# Patient Record
Sex: Female | Born: 1965
Health system: Southern US, Community
[De-identification: ages and names within clinical notes are randomized; demographics above are authoritative.]

## PROBLEM LIST (undated history)

## (undated) DIAGNOSIS — I10 Essential (primary) hypertension: Secondary | ICD-10-CM

## (undated) DIAGNOSIS — K219 Gastro-esophageal reflux disease without esophagitis: Secondary | ICD-10-CM

## (undated) DIAGNOSIS — E119 Type 2 diabetes mellitus without complications: Secondary | ICD-10-CM

## (undated) HISTORY — DX: Gastro-esophageal reflux disease without esophagitis: K21.9

## (undated) HISTORY — PX: PACEMAKER IMPLANT: EP1218

## (undated) HISTORY — PX: TUBAL LIGATION: SHX77

---

## 2017-05-12 ENCOUNTER — Other Ambulatory Visit: Payer: Self-pay

## 2017-05-12 ENCOUNTER — Emergency Department (HOSPITAL_COMMUNITY): Payer: Self-pay

## 2017-05-12 ENCOUNTER — Emergency Department (HOSPITAL_COMMUNITY)
Admission: EM | Admit: 2017-05-12 | Discharge: 2017-05-13 | Disposition: A | Discharge status: Home / Self Care | Payer: Self-pay | Attending: Emergency Medicine | Admitting: Emergency Medicine

## 2017-05-12 ENCOUNTER — Encounter (HOSPITAL_COMMUNITY): Payer: Self-pay | Admitting: *Deleted

## 2017-05-12 DIAGNOSIS — M549 Dorsalgia, unspecified: Secondary | ICD-10-CM | POA: Insufficient documentation

## 2017-05-12 DIAGNOSIS — I10 Essential (primary) hypertension: Secondary | ICD-10-CM | POA: Insufficient documentation

## 2017-05-12 DIAGNOSIS — R000 Tachycardia, unspecified: Secondary | ICD-10-CM | POA: Insufficient documentation

## 2017-05-12 DIAGNOSIS — E119 Type 2 diabetes mellitus without complications: Secondary | ICD-10-CM | POA: Insufficient documentation

## 2017-05-12 DIAGNOSIS — Z794 Long term (current) use of insulin: Secondary | ICD-10-CM | POA: Insufficient documentation

## 2017-05-12 DIAGNOSIS — R0600 Dyspnea, unspecified: Secondary | ICD-10-CM | POA: Insufficient documentation

## 2017-05-12 DIAGNOSIS — Z79899 Other long term (current) drug therapy: Secondary | ICD-10-CM | POA: Insufficient documentation

## 2017-05-12 DIAGNOSIS — Z950 Presence of cardiac pacemaker: Secondary | ICD-10-CM | POA: Insufficient documentation

## 2017-05-12 DIAGNOSIS — I495 Sick sinus syndrome: Secondary | ICD-10-CM | POA: Insufficient documentation

## 2017-05-12 DIAGNOSIS — Z7982 Long term (current) use of aspirin: Secondary | ICD-10-CM | POA: Insufficient documentation

## 2017-05-12 DIAGNOSIS — Z87891 Personal history of nicotine dependence: Secondary | ICD-10-CM | POA: Insufficient documentation

## 2017-05-12 HISTORY — DX: Essential (primary) hypertension: I10

## 2017-05-12 HISTORY — DX: Type 2 diabetes mellitus without complications: E11.9

## 2017-05-12 LAB — BASIC METABOLIC PANEL
Anion gap: 9 (ref 5–15)
BUN: 13 mg/dL (ref 6–20)
CALCIUM: 9 mg/dL (ref 8.9–10.3)
CHLORIDE: 102 mmol/L (ref 101–111)
CO2: 26 mmol/L (ref 22–32)
CREATININE: 1.09 mg/dL — AB (ref 0.44–1.00)
GFR calc non Af Amer: 57 mL/min — ABNORMAL LOW (ref >60)
Glucose, Bld: 346 mg/dL — ABNORMAL HIGH (ref 65–99)
Potassium: 4 mmol/L (ref 3.5–5.1)
SODIUM: 137 mmol/L (ref 135–145)

## 2017-05-12 LAB — I-STAT TROPONIN, ED: TROPONIN I, POC: 0.02 ng/mL (ref 0.00–0.08)

## 2017-05-12 LAB — I-STAT BETA HCG BLOOD, ED (MC, WL, AP ONLY)

## 2017-05-12 LAB — CBC
HCT: 40.2 % (ref 36.0–46.0)
Hemoglobin: 13 g/dL (ref 12.0–15.0)
MCH: 22.1 pg — AB (ref 26.0–34.0)
MCHC: 32.3 g/dL (ref 30.0–36.0)
MCV: 68.5 fL — AB (ref 78.0–100.0)
PLATELETS: 294 10*3/uL (ref 150–400)
RBC: 5.87 MIL/uL — AB (ref 3.87–5.11)
RDW: 15.3 % (ref 11.5–15.5)
WBC: 10.8 10*3/uL — AB (ref 4.0–10.5)

## 2017-05-12 NOTE — ED Triage Notes (Signed)
Pt in c/o fast heart rate last night at work that went away and returned 4 hrs ago, pt SOB with exertion, denies n/v/d, pt reports pacemaker placedment 4/18, A&O x4

## 2017-05-12 NOTE — ED Provider Notes (Signed)
[facility] EMERGENCY DEPARTMENT Provider Note   CSN: [id number] Arrival date & time: 05/12/17  1503     History   Chief Complaint Chief Complaint  Patient presents with  . Tachycardia    HPI  Blood pressure 113/78, pulse (!) 59, temperature 97.8 F (36.6 C), temperature source Oral, resp. rate 20, height 5' (1.524 m), weight 95.3 kg (210 lb), SpO2 100 %.  Savannah Nielsen is a 51 y.o. female with past medical history significant for tachybradycardia syndrome with Biotronik pacemaker implanted in April (needed recalibration in July, was placed in [place]) he moved back to the area approximately 1 month ago complaining of tachycardia, upper thoracic back pain and dyspnea on exertion when she was driving into work today at 1 PM. All symptoms have slowly improved and she is asymptomatic at this time.  She never had any chest pain, syncope, increasing peripheral edema, calf pain or leg swelling, orthopnea, PND.  He does not have a local cardiologist or primary care physician.  This that her blood sugars have been running in the upper 200s recently cough, fever, chills, nausea, vomiting, change in bowel or bladder habits including dysuria, hematuria, abnormal vaginal discharge.  Past Medical History:  Diagnosis Date  . Diabetes mellitus without complication (HCC)   . Hypertension     There are no active problems to display for this patient.   Past Surgical History:  Procedure Laterality Date  . PACEMAKER IMPLANT    . TUBAL LIGATION      OB History    No data available       Home Medications    Prior to Admission medications   Medication Sig Start Date End Date Taking? Authorizing Provider  Ascorbic Acid (VITAMIN C PO) Take 1 tablet by mouth daily.   Yes [provider]  aspirin EC 81 MG tablet Take 81 mg by mouth daily.   Yes [provider]  Cholecalciferol (VITAMIN D-3 PO) Take 1 capsule by mouth daily.   Yes [provider]  Cyanocobalamin (VITAMIN B-12 PO) Take 1 tablet by mouth daily.   Yes [provider]  LANTUS 100 UNIT/ML injection Inject 32 Units into the skin at bedtime. 02/28/17  Yes [provider]  losartan-hydrochlorothiazide (HYZAAR) 100-25 MG tablet Take 1 tablet by mouth daily.   Yes [provider]  metFORMIN (GLUCOPHAGE-XR) 500 MG 24 hr tablet Take 500 mg by mouth 2 (two) times daily.   Yes [provider]  metoprolol tartrate (LOPRESSOR) 25 MG tablet Take 25 mg by mouth 2 (two) times daily.   Yes [provider]  Multiple Vitamins-Calcium (ONE-A-DAY WOMENS FORMULA PO) Take 1 tablet by mouth daily.   Yes [provider]  naproxen sodium (ALEVE) 220 MG tablet Take 440 mg by mouth daily with breakfast.   Yes [provider]  Omega-3 Fatty Acids (OMEGA-3 FISH OIL CONCENTRATE PO) Take 1 capsule by mouth daily.   Yes [provider]  ranitidine (ZANTAC) 150 MG tablet Take 150 mg by mouth 2 (two) times daily.   Yes [provider]    Family History No family history on file.  Social History Social History   Tobacco Use  . Smoking status: Former Smoker    Last attempt to quit: 05/30/1994    Years since quitting: 22.9  . Smokeless tobacco: Never Used  Substance Use Topics  . Alcohol use: No    Frequency: Never  . Drug use: No  Allergies   Betadine [povidone iodine]   Review of Systems Review of Systems  A complete review of systems was obtained and all systems are negative except as noted in the HPI and PMH.   Physical Exam Updated Vital Signs BP 130/62   Pulse (!) 59   Temp 97.8 F (36.6 C) (Oral)   Resp 19   Ht 5' (1.524 m)   Wt 95.3 kg (210 lb)   SpO2 99%   BMI 41.01 kg/m   Physical Exam  Constitutional: She is oriented to person, place, and time. She appears well-developed and well-nourished. No distress.  HENT:  Head: Normocephalic and atraumatic.  Mouth/Throat: Oropharynx  is clear and moist.  Eyes: Conjunctivae and EOM are normal. Pupils are equal, round, and reactive to light.  Neck: Normal range of motion. No JVD present. No tracheal deviation present.  Cardiovascular: Normal rate, regular rhythm and intact distal pulses.  Radial pulse equal bilaterally  Pulmonary/Chest: Effort normal and breath sounds normal. No stridor. No respiratory distress. She has no wheezes. She has no rales. She exhibits no tenderness.  Abdominal: Soft. She exhibits no distension and no mass. There is no tenderness. There is no rebound and no guarding.  Musculoskeletal: Normal range of motion. She exhibits no edema or tenderness.  No calf asymmetry, superficial collaterals, palpable cords, edema, Homans sign negative bilaterally.    Neurological: She is alert and oriented to person, place, and time.  Skin: Skin is warm. She is not diaphoretic.  Psychiatric: She has a normal mood and affect.  Nursing note and vitals reviewed.    ED Treatments / Results  Labs (all labs ordered are listed, but only abnormal results are displayed) Labs Reviewed  BASIC METABOLIC PANEL - Abnormal; Notable for the following components:      Result Value   Glucose, Bld 346 (*)    Creatinine, Ser 1.09 (*)    GFR calc non Af Amer 57 (*)    All other components within normal limits  CBC - Abnormal; Notable for the following components:   WBC 10.8 (*)    RBC 5.87 (*)    MCV 68.5 (*)    MCH 22.1 (*)    All other components within normal limits  D-DIMER, QUANTITATIVE (NOT AT [facility])  URINALYSIS, ROUTINE W REFLEX MICROSCOPIC  I-STAT TROPONIN, ED  I-STAT BETA HCG BLOOD, ED ([facility], [facility], [facility] ONLY)  I-STAT TROPONIN, ED    EKG  EKG Interpretation  Date/Time:  Friday May 12 2017 15:11:12 EST Ventricular Rate:  60 PR Interval:  186 QRS Duration: 148 QT Interval:  462 QTC Calculation: 462 R Axis:   84 Text Interpretation:  AV dual-paced rhythm Abnormal ECG No previous ECGs available Confirmed by  Glynn Octaveancour, Stephen [phone]([id number]) on 05/12/2017 10:57:15 PM       Radiology Dg Chest 2 View  Result Date: 05/12/2017 CLINICAL DATA:  Tachycardia EXAM: CHEST  2 VIEW COMPARISON:  None. FINDINGS: There is a pacemaker with leads attached to the right heart. Heart is upper normal in size with pulmonary vascularity within normal limits. No adenopathy. Lungs are clear. No bone lesions. IMPRESSION: Heart upper normal in size with pacemaker leads attached to right heart. No edema or consolidation. Electronically Signed   By: Bretta BangWilliam  Woodruff III M.D.   On: 05/12/2017 16:46    Procedures Procedures (including critical care time)  Medications Ordered in ED Medications - No data to display   Initial Impression / Assessment and Plan / ED Course  I have  reviewed the triage vital signs and the nursing notes.  Pertinent labs & imaging results that were available during my care of the patient were reviewed by me and considered in my medical decision making (see chart for details).     Vitals:   05/12/17 2315 05/12/17 2345 05/13/17 0015 05/13/17 0030  BP: 133/61 130/64 (!) 146/77 130/62  Pulse: (!) 58 (!) 58 60 (!) 59  Resp: 17 (!) 21 19 19   Temp:      TempSrc:      SpO2: 100% 100% 99% 99%  Weight:      Height:         Savannah Nielsen is 51 y.o. female presenting with palpitations, tachycardia with associated thoracic back pain resolving after the course of an hour.  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  Symptoms have completely resolved.  Her tachycardia has resolved and she is bradycardic.  EKG with dual pacing.  Cardiology consult from Dr. Vonzella NippleWosik appreciated: Recommends after interrogating device to of course verify that the device is functional, make sure that it was not a run of V. tach or V. fib,  consider tilt 30 mg/day as needed for prolonged A. fib, calculate chads vas, given the white count of 10 and hyperglycemia recommends UA to see if there is other pathology driving the symptoms.  Verbal report given to attending physician which showed paced tachycardia at 110.  She had some runs of SVT several days ago.  Patient has been on the monitor in the ED, no tachycardia noted.  2 troponins negative, d-dimer.  Patient has been pain-free since being in the ED.  Vital signs reassuring.  Not orthostatic by vital signs.  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  Evaluation does not show pathology that would require ongoing emergent intervention or inpatient treatment. Pt is hemodynamically stable and mentating appropriately. Discussed findings and plan with patient/guardian, who agrees with care plan. All questions answered. Return precautions discussed and outpatient follow up given.   Final Clinical Impressions(s) / ED Diagnoses   Final diagnoses:  Tachycardia  Pacemaker  Tachy-brady syndrome [facility](HCC)    ED Discharge Orders    None       Lynetta Mareisciotta, Mardella Laymanicole, PA-C 05/13/17 [id number]    Rancour, Stephen, MD 05/13/17 [phone]

## 2017-05-13 LAB — URINALYSIS, ROUTINE W REFLEX MICROSCOPIC
Bilirubin Urine: NEGATIVE
GLUCOSE, UA: NEGATIVE mg/dL
Hgb urine dipstick: NEGATIVE
KETONES UR: NEGATIVE mg/dL
LEUKOCYTES UA: NEGATIVE
NITRITE: NEGATIVE
PROTEIN: NEGATIVE mg/dL
Specific Gravity, Urine: 1.013 (ref 1.005–1.030)
pH: 5 (ref 5.0–8.0)

## 2017-05-13 LAB — I-STAT TROPONIN, ED: Troponin i, poc: 0 ng/mL (ref 0.00–0.08)

## 2017-05-13 LAB — D-DIMER, QUANTITATIVE (NOT AT ARMC): D DIMER QUANT: 0.35 ug{FEU}/mL (ref 0.00–0.50)

## 2017-05-13 NOTE — Discharge Instructions (Addendum)
Please follow with your primary care doctor in the next 2 days for a check-up. They must obtain records for further management.  ° °Do not hesitate to return to the Emergency Department for any new, worsening or concerning symptoms.  ° °

## 2017-05-13 NOTE — ED Notes (Signed)
Pt given sandwich and water, OK per MD.

## 2017-05-16 ENCOUNTER — Telehealth: Payer: Self-pay | Admitting: Surgery

## 2017-05-16 NOTE — Telephone Encounter (Signed)
ED CM attempted to reach patient at verified phone number number to provide Cardiology appt information 06/01/17 at 3:30 pm to establish care with Dr. Katrinka BlazingSmith. CM LVM for a return call at her earliest  Conveniences.

## 2017-05-17 DIAGNOSIS — E119 Type 2 diabetes mellitus without complications: Secondary | ICD-10-CM | POA: Insufficient documentation

## 2017-05-17 DIAGNOSIS — I10 Essential (primary) hypertension: Secondary | ICD-10-CM

## 2017-06-01 ENCOUNTER — Ambulatory Visit: Payer: BLUE CROSS/BLUE SHIELD | Admitting: Cardiology

## 2017-06-01 ENCOUNTER — Encounter: Payer: Self-pay | Admitting: Cardiology

## 2017-06-01 VITALS — BP 128/76 | HR 60 | Ht 60.0 in | Wt 210.0 lb

## 2017-06-01 DIAGNOSIS — I471 Supraventricular tachycardia: Secondary | ICD-10-CM

## 2017-06-01 DIAGNOSIS — I495 Sick sinus syndrome: Secondary | ICD-10-CM | POA: Diagnosis not present

## 2017-06-01 DIAGNOSIS — Z950 Presence of cardiac pacemaker: Secondary | ICD-10-CM | POA: Diagnosis not present

## 2017-06-01 MED ORDER — METOPROLOL TARTRATE 25 MG PO TABS: 25.0000 mg | ORAL_TABLET | Freq: Two times a day (BID) | ORAL | 3 refills | Status: AC

## 2017-06-01 MED FILL — METOPROLOL TARTRATE 25 MG T: 25 | 90 days supply | Qty: 200 | Fill #0

## 2017-06-01 NOTE — Progress Notes (Signed)
Cardiology Office Note:    Date:  06/01/2017   ID:  Savannah Nielsen, DOB 01/11/1966, MRN [id number]  PCP:  Patient, No Pcp Per  Cardiologist:  Donato Schultz, MD   Referring MD: No ref. provider found     History of Present Illness:    Savannah Nielsen is a 52 y.o. female here for the evaluation of tachycardia at the request of Dr. Manus Gunning.  She was seen in the emergency department on 05/13/17 with prior history of tachybradycardia syndrome status post pacemaker then with episode of palpitations shortness of breath upper back pain for about 45 minutes.  She did not have any chest pain.  D-dimer and serial troponins were negative.  Pacemaker interrogation showed sinus tachycardia.  No ventricular tachycardia or ventricular fibrillation. She has a Biotronik pacemaker implanted in April 2018 placed in [place].  Felt heart pounding. On way to work, got someting to eat.   HR 120 then 60. Happened when walking in [place]. Fixed atrial lead. Calibrate.   04/2016 - working in [place], walking hall, lighteaded. Sat down. BP 183/120, BS 389. To ER. A1c 14.2. Losartan.  She did end up seeing a cardiologist there who stated that she likely need a pacemaker.  He placed a 30-day event monitor and she had 2 significant pauses 1 3 seconds and 1 3.5 seconds.  1 of them was at night and 1 of them was during the day when she was sitting down she felt a fuzzy feeling and thought her blood sugar was abnormal.  It was not.  She was then called and she was told that she had a pause.  Pacer needed. Maybe hadsyncope as child.   No tob. Mother died MI, CABG 2 years before. Father MI, CVA.   She is a traveling Engineer, civil (consulting), LPN.  Last job was in [place].  Works primarily with geriatrics. Lives in [place].  Past Medical History:  Diagnosis Date  . Diabetes mellitus without complication (HCC)   . Hypertension     Past Surgical History:  Procedure Laterality Date  . PACEMAKER IMPLANT    . TUBAL  LIGATION      Current Medications: Current Meds  Medication Sig  . Ascorbic Acid (VITAMIN C PO) Take 1 tablet by mouth daily.  Marland Kitchen aspirin EC 81 MG tablet Take 81 mg by mouth daily.  . Cholecalciferol (VITAMIN D-3 PO) Take 1 capsule by mouth daily.  . Cyanocobalamin (VITAMIN B-12 PO) Take 1 tablet by mouth daily.  Marland Kitchen LANTUS 100 UNIT/ML injection Inject 32 Units into the skin at bedtime.  Marland Kitchen losartan-hydrochlorothiazide (HYZAAR) 100-25 MG tablet Take 1 tablet by mouth daily.  . metFORMIN (GLUCOPHAGE-XR) 500 MG 24 hr tablet Take 500 mg by mouth 2 (two) times daily.  . metoprolol tartrate (LOPRESSOR) 25 MG tablet Take 25 mg by mouth 2 (two) times daily.  . Multiple Vitamins-Calcium (ONE-A-DAY WOMENS FORMULA PO) Take 1 tablet by mouth daily.  . naproxen sodium (ALEVE) 220 MG tablet Take 440 mg by mouth daily with breakfast.  . Omega-3 Fatty Acids (OMEGA-3 FISH OIL CONCENTRATE PO) Take 1 capsule by mouth daily.  . ranitidine (ZANTAC) 150 MG tablet Take 150 mg by mouth 2 (two) times daily.     Allergies:   Betadine [povidone iodine]   Social History   Socioeconomic History  . Marital status: Divorced    Spouse name: None  . Number of children: None  . Years of education: None  . Highest education level: None  Social Needs  .  Financial resource strain: None  . Food insecurity - worry: None  . Food insecurity - inability: None  . Transportation needs - medical: None  . Transportation needs - non-medical: None  Occupational History  . None  Tobacco Use  . Smoking status: Former Smoker    Last attempt to quit: 05/30/1994    Years since quitting: 23.0  . Smokeless tobacco: Never Used  Substance and Sexual Activity  . Alcohol use: No    Frequency: Never  . Drug use: No  . Sexual activity: None  Other Topics Concern  . None  Social History Narrative  . None     Family History: Mother with MI, CABG  ROS:   Please see the history of present illness.     All other systems  reviewed and are negative.  EKGs/Labs/Other Studies Reviewed:    The following studies were reviewed today: Prior lab work, EKG, office notes reviewed  EKG:  EKG is ordered today.  The ekg ordered today demonstrates AV pacing  Recent Labs: 05/12/2017: BUN 13; Creatinine, Ser 1.09; Hemoglobin 13.0; Platelets 294; Potassium 4.0; Sodium 137  Recent Lipid Panel No results found for: CHOL, TRIG, HDL, CHOLHDL, VLDL, LDLCALC, LDLDIRECT  Physical Exam:    VS:  BP 128/76   Pulse 60   Ht 5' (1.524 m)   Wt 210 lb (95.3 kg)   SpO2 97%   BMI 41.01 kg/m     Wt Readings from Last 3 Encounters:  06/01/17 210 lb (95.3 kg)  05/12/17 210 lb (95.3 kg)     GEN:  Well nourished, well developed in no acute distress HEENT: Normal NECK: No JVD; No carotid bruits LYMPHATICS: No lymphadenopathy CARDIAC: RRR, no murmurs, rubs, gallops RESPIRATORY:  Clear to auscultation without rales, wheezing or rhonchi  ABDOMEN: Soft, non-tender, non-distended MUSCULOSKELETAL:  No edema; No deformity  SKIN: Warm and dry NEUROLOGIC:  Alert and oriented x 3 PSYCHIATRIC:  Normal affect   ASSESSMENT:    1. PAT (paroxysmal atrial tachycardia) (HCC)   2. Tachycardia-bradycardia syndrome (HCC)   3. Pacemaker    PLAN:    In order of problems listed above:  Pacemaker sick sinus syndrome/tachycardia-bradycardia syndrome - Biotronik.  She states that it was recalibrated a proximally 1 month after implant in [place].  Sounds like she has a history of tachycardia/bradycardia syndrome.  Continue with metoprolol 25 mill grams twice a day and she may take an extra 25 mg as needed tachycardia.  The way she describes her tacky arrhythmia it sounds like she was going 120 bpm suddenly and then suddenly dropping back down to 60.  Could be atrial tachycardia.  Diabetes with hypertension -Much better control with insulin.  She needs to establish with a primary physician here.  We will help her.  Medication Adjustments/Labs  and Tests Ordered: Current medicines are reviewed at length with the patient today.  Concerns regarding medicines are outlined above.  No orders of the defined types were placed in this encounter.  No orders of the defined types were placed in this encounter.   Signed, Donato SchultzMark Skains, MD  06/01/2017 4:46 PM    [facility]

## 2017-06-01 NOTE — Patient Instructions (Addendum)
Medication Instructions:  You may take an extra Metoprolol as needed for tachycardia (rapid heartbeat). Please continue all other medications as listed.  You have been referred to Electrophysiology to establish for follow up of your pacemaker.  Follow-Up: Follow up in 6 months with Dr. Anne FuSkains.  You will receive a letter in the mail 2 months before you are due.  Please call us when you receive this letter to schedule your follow up appointment.  If you need a refill on your cardiac medications before your next appointment, please call your pharmacy.  Thank you for choosing [facility]!!

## 2017-06-01 NOTE — Addendum Note (Signed)
Addended by: Sharin GraveFLEMING, Sharita Bienaime J on: 06/01/2017 04:54 PM   Modules accepted: Orders

## 2017-06-13 ENCOUNTER — Ambulatory Visit: Payer: BLUE CROSS/BLUE SHIELD | Admitting: Internal Medicine

## 2017-06-13 ENCOUNTER — Encounter: Payer: Self-pay | Admitting: Internal Medicine

## 2017-06-13 VITALS — BP 126/82 | HR 60 | Ht 60.0 in | Wt 211.0 lb

## 2017-06-13 DIAGNOSIS — Z950 Presence of cardiac pacemaker: Secondary | ICD-10-CM

## 2017-06-13 DIAGNOSIS — R400 Somnolence: Secondary | ICD-10-CM | POA: Diagnosis not present

## 2017-06-13 DIAGNOSIS — R0683 Snoring: Secondary | ICD-10-CM | POA: Diagnosis not present

## 2017-06-13 DIAGNOSIS — R5383 Other fatigue: Secondary | ICD-10-CM

## 2017-06-13 DIAGNOSIS — I495 Sick sinus syndrome: Secondary | ICD-10-CM | POA: Diagnosis not present

## 2017-06-13 LAB — CUP PACEART INCLINIC DEVICE CHECK
Date Time Interrogation Session: 20190115132803
MDC IDC PG IMPLANT DT: 20180411
Pulse Gen Model: 407145
Pulse Gen Serial Number: 69007337

## 2017-06-13 NOTE — Progress Notes (Signed)
HPI Savannah Nielsen is referred today by Dr. Anne Fu for evaluation of and management of her DDD PM. She is a pleasant 52 yo woman with syncope and tachy-brady syndrome who developed high grade heart block and underwent insertion of a Biotronik DDD PM in 2018. She has moved to [place], as she works as an Public house manager. She denies chest pain or sob.  Allergies  Allergen Reactions  . Betadine [Povidone Iodine] Hives and Rash     Current Outpatient Medications  Medication Sig Dispense Refill  . Ascorbic Acid (VITAMIN C PO) Take 1 tablet by mouth daily.    Marland Kitchen aspirin EC 81 MG tablet Take 81 mg by mouth daily.    . Cholecalciferol (VITAMIN D-3 PO) Take 1 capsule by mouth daily.    . Cyanocobalamin (VITAMIN B-12 PO) Take 1 tablet by mouth daily.    Marland Kitchen LANTUS 100 UNIT/ML injection Inject 32 Units into the skin at bedtime.  0  . losartan-hydrochlorothiazide (HYZAAR) 100-25 MG tablet Take 1 tablet by mouth daily.    . metFORMIN (GLUCOPHAGE-XR) 500 MG 24 hr tablet Take 500 mg by mouth 2 (two) times daily.    . metoprolol tartrate (LOPRESSOR) 25 MG tablet Take 1 tablet (25 mg total) by mouth 2 (two) times daily. Or as needed 200 tablet 3  . Multiple Vitamins-Calcium (ONE-A-DAY WOMENS FORMULA PO) Take 1 tablet by mouth daily.    . naproxen sodium (ALEVE) 220 MG tablet Take 440 mg by mouth daily with breakfast.    . Omega-3 Fatty Acids (OMEGA-3 FISH OIL CONCENTRATE PO) Take 1 capsule by mouth daily.    . ranitidine (ZANTAC) 150 MG tablet Take 150 mg by mouth 2 (two) times daily.     No current facility-administered medications for this visit.      Past Medical History:  Diagnosis Date  . Diabetes mellitus without complication (HCC)   . Hypertension     ROS:   All systems reviewed and negative except as noted in the HPI.   Past Surgical History:  Procedure Laterality Date  . PACEMAKER IMPLANT     Biotronik  . TUBAL LIGATION       No family history on file.   Social History    Socioeconomic History  . Marital status: Divorced    Spouse name: Not on file  . Number of children: Not on file  . Years of education: Not on file  . Highest education level: Not on file  Social Needs  . Financial resource strain: Not on file  . Food insecurity - worry: Not on file  . Food insecurity - inability: Not on file  . Transportation needs - medical: Not on file  . Transportation needs - non-medical: Not on file  Occupational History  . Not on file  Tobacco Use  . Smoking status: Former Smoker    Last attempt to quit: 05/30/1994    Years since quitting: 23.0  . Smokeless tobacco: Never Used  Substance and Sexual Activity  . Alcohol use: No    Frequency: Never  . Drug use: No  . Sexual activity: Not on file  Other Topics Concern  . Not on file  Social History Narrative  . Not on file     BP 126/82   Pulse 60   Ht 5' (1.524 m)   Wt 211 lb (95.7 kg)   SpO2 96%   BMI 41.21 kg/m   Physical Exam:  Well appearing NAD HEENT: Unremarkable Neck:  No  JVD, no thyromegally Lymphatics:  No adenopathy Back:  No CVA tenderness Lungs:  Clear HEART:  Regular rate rhythm, no murmurs, no rubs, no clicks Abd:  soft, positive bowel sounds, no organomegally, no rebound, no guarding Ext:  2 plus pulses, no edema, no cyanosis, no clubbing Skin:  No rashes no nodules Neuro:  CN II through XII intact, motor grossly intact  EKG - nsr with ventricular pacing  DEVICE  Normal device function.  See PaceArt for details.   Assess/Plan: 1. High grade heart block - her device had been programmed to encouraged RV conduction but she was pacing at an AV delay of 400 ms.  2. PPM - her Biotronik device was reprogrammed to an AV delay of 130/160.  3. SVT - none noted on interogation. She will continue her current meds and undergo watchful waiting.  4. HTN - her blood pressure is well controlled. No change in meds. 5. Snoring - she carries a h/o loud snoring and has daytime  somnolence. I have recommended she undergo sleep study.  Savannah Nielsen,M.D.

## 2017-06-13 NOTE — Patient Instructions (Addendum)
Medication Instructions:  Your physician recommends that you continue on your current medications as directed. Please refer to the Current Medication list given to you today.  Labwork: None ordered.  Testing/Procedures: Your physician has recommended that you have a sleep study. This test records several body functions during sleep, including: brain activity, eye movement, oxygen and carbon dioxide blood levels, heart rate and rhythm, breathing rate and rhythm, the flow of air through your mouth and nose, snoring, body muscle movements, and chest and belly movement.  Follow-Up: Your physician wants you to follow-up in: One Year with Dr Ladona Ridgelaylor. You will receive a reminder letter in the mail two months in advance. If you don't receive a letter, please call our office to schedule the follow-up appointment.  Remote monitoring is used to monitor your Pacemaker from home. This monitoring reduces the number of office visits required to check your device to one time per year. It allows us to keep an eye on the functioning of your device to ensure it is working properly. You are scheduled for a device check from home on 09/12/2017. You may send your transmission at any time that day. If you have a wireless device, the transmission will be sent automatically. After your physician reviews your transmission, you will receive a postcard with your next transmission date.     Any Other Special Instructions Will Be Listed Below (If Applicable).     If you need a refill on your cardiac medications before your next appointment, please call your pharmacy.

## 2017-06-14 ENCOUNTER — Telehealth: Payer: Self-pay | Admitting: *Deleted

## 2017-06-14 NOTE — Telephone Encounter (Signed)
Per Dr Ladona Ridgelaylor split night sleep study ordered

## 2017-06-15 ENCOUNTER — Ambulatory Visit: Payer: BLUE CROSS/BLUE SHIELD | Admitting: Family Medicine

## 2017-06-15 ENCOUNTER — Encounter: Payer: Self-pay | Admitting: Family Medicine

## 2017-06-15 ENCOUNTER — Other Ambulatory Visit: Payer: Self-pay

## 2017-06-15 VITALS — BP 130/74 | HR 86 | Temp 98.6°F | Resp 17 | Ht 60.0 in | Wt 211.0 lb

## 2017-06-15 DIAGNOSIS — Z794 Long term (current) use of insulin: Secondary | ICD-10-CM | POA: Diagnosis not present

## 2017-06-15 DIAGNOSIS — Z1231 Encounter for screening mammogram for malignant neoplasm of breast: Secondary | ICD-10-CM | POA: Diagnosis not present

## 2017-06-15 DIAGNOSIS — Z23 Encounter for immunization: Secondary | ICD-10-CM

## 2017-06-15 DIAGNOSIS — I10 Essential (primary) hypertension: Secondary | ICD-10-CM

## 2017-06-15 DIAGNOSIS — E118 Type 2 diabetes mellitus with unspecified complications: Secondary | ICD-10-CM | POA: Diagnosis not present

## 2017-06-15 DIAGNOSIS — Z1239 Encounter for other screening for malignant neoplasm of breast: Secondary | ICD-10-CM

## 2017-06-15 DIAGNOSIS — E1165 Type 2 diabetes mellitus with hyperglycemia: Secondary | ICD-10-CM

## 2017-06-15 LAB — POCT GLYCOSYLATED HEMOGLOBIN (HGB A1C): HEMOGLOBIN A1C: 10

## 2017-06-15 MED ORDER — LOSARTAN POTASSIUM-HCTZ 100-25 MG PO TABS: 1.0000 | ORAL_TABLET | Freq: Every day | ORAL | 1 refills | Status: DC

## 2017-06-15 MED ORDER — METFORMIN HCL ER 500 MG PO TB24: ORAL_TABLET | ORAL | 1 refills | Status: AC

## 2017-06-15 MED ORDER — LANTUS 100 UNIT/ML ~~LOC~~ SOLN: 40.0000 [IU] | Freq: Every day | SUBCUTANEOUS | 0 refills | Status: AC

## 2017-06-15 MED FILL — METFORMIN HCL ER 500 MG TAB: 500 | 90 days supply | Qty: 270 | Fill #0

## 2017-06-15 MED FILL — LANTUS 100 UNITS/ML VIAL: 100 | 25 days supply | Qty: 10 | Fill #0

## 2017-06-15 MED FILL — LOSARTAN-HCTZ 100-25 MG TAB: 100-25 | 90 days supply | Qty: 90 | Fill #0

## 2017-06-15 NOTE — Progress Notes (Signed)
Chief Complaint  Patient presents with  . New Patient (Initial Visit)    establish care.  Per pt concerned about diabetes  . Medication Refill    see list, all but metoprolol as pt gets this from the cardiologist    HPI  Pt was seeing Cardiology for Pacemaker   Diabetes Mellitus: Patient presents for follow up of diabetes.  Diagnosed December 2017 with a1c 14.6%  Symptoms: hyperglycemia and polyuria. Symptoms have been intermittent. Patient denies foot ulcerations, hypoglycemia , nausea and visual disturbances.  Evaluation to date has been included: hemoglobin A1C.  Home sugars: BGs are high in the evening.   She reports that she is now on Lantus 32 units and needs refill She also takes metformin 500 mg bid  She states that she needs med refills Her metoprolol is managed by Cardiology/Electrophysiology  Lab Results  Component Value Date   HGBA1C 10.0 06/15/2017   Fasting glucose fluctuates around 200s  Wt Readings from Last 3 Encounters:  06/15/17 211 lb (95.7 kg)  06/13/17 211 lb (95.7 kg)  06/01/17 210 lb (95.3 kg)   Hypertension: Patient here for follow-up of elevated blood pressure. She is not exercising and is not adherent to low salt diet.  Blood pressure is well controlled at home. Cardiac symptoms none. Patient denies chest pain, chest pressure/discomfort, claudication, exertional chest pressure/discomfort, irregular heart beat, lower extremity edema and orthopnea.  Cardiovascular risk factors: dyslipidemia, hypertension, obesity (BMI >= 30 kg/m2) and sedentary lifestyle. Use of agents associated with hypertension: none. BP Readings from Last 3 Encounters:  06/15/17 130/74  06/13/17 126/82  06/01/17 128/76   Morbid Obesity Body mass index is 41.21 kg/m. Pt reports that she has been having difficulty staying on an exercise program.  She works as a travel Engineer, civil (consulting) in [employer]. She states that she has been having difficulty with her sleep schedule and having time for  exercise. She also eats at odd hours due to working nights sometimes.    Past Medical History:  Diagnosis Date  . Diabetes mellitus without complication (HCC)   . GERD (gastroesophageal reflux disease)   . Hypertension     Current Outpatient Medications  Medication Sig Dispense Refill  . Ascorbic Acid (VITAMIN C PO) Take 1 tablet by mouth daily.    Marland Kitchen aspirin EC 81 MG tablet Take 81 mg by mouth daily.    . Cholecalciferol (VITAMIN D-3 PO) Take 1 capsule by mouth daily.    . Cyanocobalamin (VITAMIN B-12 PO) Take 1 tablet by mouth daily.    Marland Kitchen LANTUS 100 UNIT/ML injection Inject 0.4 mLs (40 Units total) into the skin at bedtime. 10 mL 0  . losartan-hydrochlorothiazide (HYZAAR) 100-25 MG tablet Take 1 tablet by mouth daily. 90 tablet 1  . metFORMIN (GLUCOPHAGE-XR) 500 MG 24 hr tablet Take 1000mg  (2 tabs) in the morning and one 500mg  in the evening 270 tablet 1  . metoprolol tartrate (LOPRESSOR) 25 MG tablet Take 1 tablet (25 mg total) by mouth 2 (two) times daily. Or as needed 200 tablet 3  . Multiple Vitamins-Calcium (ONE-A-DAY WOMENS FORMULA PO) Take 1 tablet by mouth daily.    . naproxen sodium (ALEVE) 220 MG tablet Take 440 mg by mouth daily with breakfast.    . Omega-3 Fatty Acids (OMEGA-3 FISH OIL CONCENTRATE PO) Take 1 capsule by mouth daily.    . ranitidine (ZANTAC) 150 MG tablet Take 150 mg by mouth 2 (two) times daily.     No current facility-administered medications for this  visit.     Allergies:  Allergies  Allergen Reactions  . Betadine [Povidone Iodine] Hives and Rash    Past Surgical History:  Procedure Laterality Date  . PACEMAKER IMPLANT     Biotronik  . TUBAL LIGATION      Social History   Socioeconomic History  . Marital status: Divorced    Spouse name: None  . Number of children: None  . Years of education: None  . Highest education level: None  Social Needs  . Financial resource strain: None  . Food insecurity - worry: None  . Food insecurity -  inability: None  . Transportation needs - medical: None  . Transportation needs - non-medical: None  Occupational History  . None  Tobacco Use  . Smoking status: Former Smoker    Last attempt to quit: 05/30/1994    Years since quitting: 23.0  . Smokeless tobacco: Never Used  Substance and Sexual Activity  . Alcohol use: No    Frequency: Never  . Drug use: No  . Sexual activity: None  Other Topics Concern  . None  Social History Narrative  . None    Family History  Problem Relation Age of Onset  . Diabetes Mother   . Hyperlipidemia Mother   . Hypertension Mother   . Diabetes Father   . Hyperlipidemia Father   . Stroke Father   . Heart disease Maternal Grandmother   . Hypertension Maternal Grandmother   . Heart disease Maternal Grandfather   . Heart disease Paternal Grandmother   . Heart disease Paternal Grandfather      ROS Review of Systems See HPI Constitution: No fevers or chills No malaise No diaphoresis Skin: No rash or itching Eyes: no blurry vision, no double vision GU: no dysuria or hematuria Neuro: no dizziness or headaches all others reviewed and negative   Objective: Vitals:   06/15/17 1420  BP: 130/74  Pulse: 86  Resp: 17  Temp: 98.6 F (37 C)  TempSrc: Oral  SpO2: 99%  Weight: 211 lb (95.7 kg)  Height: 5' (1.524 m)   Body mass index is 41.21 kg/m.  Diabetic Foot Exam - Simple   Simple Foot Form Visual Inspection No deformities, no ulcerations, no other skin breakdown bilaterally:  Yes Sensation Testing Intact to touch and monofilament testing bilaterally:  Yes Pulse Check Posterior Tibialis and Dorsalis pulse intact bilaterally:  Yes Comments      Physical Exam  Constitutional: She is oriented to person, place, and time. She appears well-developed and well-nourished.  HENT:  Head: Normocephalic and atraumatic.  Eyes: Conjunctivae and EOM are normal.  Cardiovascular: Normal rate, regular rhythm and normal heart sounds.  No  murmur heard. Pulmonary/Chest: Effort normal and breath sounds normal. No stridor. No respiratory distress.  Neurological: She is alert and oriented to person, place, and time.  Skin: Skin is warm. Capillary refill takes less than 2 seconds.  Psychiatric: She has a normal mood and affect. Her behavior is normal. Judgment and thought content normal.     Assessment and Plan Savannah BabinskiMarilyn was seen today for new patient (initial visit) and medication refill.  Diagnoses and all orders for this visit:  Type 2 diabetes mellitus with complication, with long-term current use of insulin (HCC) -     POCT glycosylated hemoglobin (Hb A1C) -     Pneumococcal polysaccharide vaccine 23-valent greater than or equal to 2yo subcutaneous/IM -     Hepatitis B vaccine adult IM  Screening for breast cancer -  MM Digital Screening; Future  Need for prophylactic vaccination against Streptococcus pneumoniae (pneumococcus) -     Pneumococcal polysaccharide vaccine 23-valent greater than or equal to 2yo subcutaneous/IM  Need for hepatitis B vaccination -     Hepatitis B vaccine adult IM  Essential hypertension- discussed importance of exercise and DASH diet  Uncontrolled type 2 diabetes mellitus with hyperglycemia (HCC)- discussed that her elevated blood glucose increse her risk of visual complications as well as kidney disease She will   Flu vaccine need -     Flu Vaccine QUAD 36+ mos IM  Other orders -     Cancel: Pneumococcal conjugate vaccine 13-valent IM -     Cancel: Tdap vaccine greater than or equal to 7yo IM -     Cancel: Pap IG, CT/NG NAA, and HPV (high risk) [facility] -     Cancel: MM Digital Screening; Future -     Cancel: Ambulatory referral to Gastroenterology -     Cancel: Pneumococcal conjugate vaccine 13-valent IM -     LANTUS 100 UNIT/ML injection; Inject 0.4 mLs (40 Units total) into the skin at bedtime. -     losartan-hydrochlorothiazide (HYZAAR) 100-25 MG tablet; Take 1 tablet  by mouth daily. -     metFORMIN (GLUCOPHAGE-XR) 500 MG 24 hr tablet; Take 1000mg  (2 tabs) in the morning and one 500mg  in the evening     Gustav Knueppel A Creta Levin

## 2017-06-15 NOTE — Patient Instructions (Addendum)
Goals:  Walk for 10 minutes daily (not walking at work or doing chores) every day Keep track of your sleep Keep track of the sugars AND fats Drink water before and after meals  Get an eye exam for medical condition of hypertension and diabetes Dr. Dione BoozeGroat Call  [phone]  or  [facility]  Ophthalmologist  [place]  [phone]   Low Glycemic Foods (20-49)  Breakfast Cereals: All-Bran                All-Bran Fruit ' n Oats Fiber One               Oatmeal (not instant)  Oat bran Fruits and fruit juices: (Limit to 1-2 servings per day) Apples               Apricots (fresh & dried)  Blackberries            Blueberries Cherries                  Cranberries             Peaches                  Pears                       Plums                       Prunes Grapefruit                Raspberries            Strawberries           Tangerine Apple juice             Grapefruit juice Tomato juice  Beans and legumes (fresh-cooked): Black-eyed peas     Butter beans Chick peas              Lentils     Green beans           Lima beans               Kidney beans          Navy beans  Non-starchy vegetables: Asparagus, bok choy, broccoli, cabbage, cauliflower, celery, cucumber, greens, lettuce, mushrooms, peppers, tomatoes, okra, onions, snow peas, spinach, summer squash  Grains: Barley                                Bulgur Rye                                    Wild rice  Nuts and oils : Almonds         Peanuts     Sunflower seeds  Hazelnuts      Pecans          Walnuts Oils that are liquid at room temperature  Dairy, fish, and meat: Milk, skim                         Lowfat cheese Yogurt, lowfat, fruit sugar sweetened Lean red meat                      Fish  Skinless chicken & Malawiturkey  Shellfish Moderate Glycemic Foods (50-69)  Breakfast Cereals: Bran Buds                             Bran Chex Just Right                            Mini-Wheats  Special K         Swiss muesli  Fruits: Banana (under-ripe)             Dates Figs                                      Grapes Kiwi                                      Mango Oranges                               Raisins  Fruit Juices: Cranberry juice                    Orange juice  Beans and legumes: [place]-type baked beans Canned pinto, kidney, or navy beans Green peas  Vegetables: Beets                         Raw Carrots  Sweet potato              Yam Corn on the cob  Breads: Pita (pocket) bread          Oat bran bread Pumpernickel bread           Rye bread Wheat bread, high fiber       Grains: Cornmeal                           Rice, brown   Rice, white                         Couscous Pasta: Macaroni                           Pizza  cheese Raviolimeat filled           Spaghetti, white        Nuts: Cashews                           Macadamia  Snacks: Chocolate                    Ice cream,lowfat  Muffin                               Popcorn High Glycemic Foods (70-100)   Breakfast Cereals: Cheerios                 Corn Chex Corn Flakes            Cream of Wheat Grape Nuts              Grape Nut Flakes  Life                 Nutri-Grain       Puffed Rice               Puffed Wheat Rice Chex                 Rice Krispies Shredded Wheat             Team Total Fruits: Pineapple                 Watermelon Banana (over-ripe)  Beverages: Sodas, sweet tea, pineapple juice  Vegetables: Potato, baked, boiled, fried, mashed [place] fries Canned or frozen corn Cooked carrots Parsnips Winter squash  Breads: Most breads (white and whole grain) Bagels                     Bread sticks Bread stuffing          Kaiser roll Dinner rolls  Grains: Rice, instant          Tapioca, with milk  Candy and most cookies Snacks: Donuts                      Corn chips        Jelly beans                 Pretzels Pastries                             Restaurant  and ethnic foods Most [place] food (sugar in stir fry or wok sauces) Teriyaki-style meats and vegetables     IF you received an x-ray today, you will receive an invoice from [facility]. Please contact [facility] Rad[facility] at [phone] with questions or concerns regarding your invoice.   IF you received labwork today, you will receive an invoice from [place]. Please contact [facility] at [phone] with questions or concerns regarding your invoice.   Our billing staff will not be able to assist you with questions regarding bills from these companies.  You will be contacted with the lab results as soon as they are available. The fastest way to get your results is to activate your My Chart account. Instructions are located on the last page of this paperwork. If you have not heard from [place] regarding the results in 2 weeks, please contact this office.    We recommend that you schedule a mammogram for breast cancer screening. Typically, you do not need a referral to do this. Please contact a local imaging center to schedule your mammogram.  [facility] - [phone]  *ask for the Radiology Department [facility]) - [phone] or [phone]  [facility] - [phone] [facility] - [phone] [facility] - [phone]  *ask for the Radiology Department [facility] - [phone]  *ask for the Radiology Department [facility] - [phone]  *ask for the Mammography Department [facility] - [phone]

## 2017-06-30 ENCOUNTER — Encounter: Payer: Self-pay | Admitting: Family Medicine

## 2017-06-30 LAB — HM DIABETES EYE EXAM

## 2017-07-02 ENCOUNTER — Encounter (HOSPITAL_BASED_OUTPATIENT_CLINIC_OR_DEPARTMENT_OTHER): Payer: BLUE CROSS/BLUE SHIELD

## 2017-07-03 ENCOUNTER — Ambulatory Visit (HOSPITAL_BASED_OUTPATIENT_CLINIC_OR_DEPARTMENT_OTHER): Payer: BLUE CROSS/BLUE SHIELD | Attending: Internal Medicine | Admitting: Cardiology

## 2017-07-03 VITALS — Ht 60.0 in | Wt 210.0 lb

## 2017-07-03 DIAGNOSIS — G4733 Obstructive sleep apnea (adult) (pediatric): Secondary | ICD-10-CM | POA: Diagnosis not present

## 2017-07-03 DIAGNOSIS — R5383 Other fatigue: Secondary | ICD-10-CM | POA: Diagnosis present

## 2017-07-03 DIAGNOSIS — R0683 Snoring: Secondary | ICD-10-CM | POA: Diagnosis not present

## 2017-07-03 DIAGNOSIS — R400 Somnolence: Secondary | ICD-10-CM | POA: Insufficient documentation

## 2017-07-05 NOTE — Procedures (Signed)
   NAME: Savannah SkeansMarilyn Louise Nielsen DATE OF BIRTH:  01/07/1966 MEDICAL RECORD NUMBER [id number]  LOCATION: [facility]  PHYSICIAN: Delisa Finck  DATE OF STUDY: 07/03/2017  SLEEP STUDY TYPE: Nocturnal Polysomnogram               REFERRING PHYSICIAN: Marinus Mawaylor, Gregg W, MD   Gender: Female D.O.B: Dec 04, 1965 Age (years): 51 Referring Provider: Lewayne BuntingGregg Taylor Height (inches): 60 Interpreting Physician: Armanda Magicraci Ora Bollig MD, ABSM Weight (lbs): 210 RPSGT: Celene KrasCharles, Nicole BMI: 41 MRN: [id number] Neck Size: 15.50  CLINICAL INFORMATION Sleep Study Type: NPSG  Indication for sleep study: Excessive Daytime Sleepiness, Fatigue, Snoring  Epworth Sleepiness Score: 8  SLEEP STUDY TECHNIQUE As per the AASM Manual for the Scoring of Sleep and Associated Events v2.3 (April 2016) with a hypopnea requiring 4% desaturations.  The channels recorded and monitored were frontal, central and occipital EEG, electrooculogram (EOG), submentalis EMG (chin), nasal and oral airflow, thoracic and abdominal wall motion, anterior tibialis EMG, snore microphone, electrocardiogram, and pulse oximetry.  MEDICATIONS Medications self-administered by patient taken the night of the study : N/A  SLEEP ARCHITECTURE The study was initiated at 10:14:19 PM and ended at 4:30:34 AM.  Sleep onset time was 71.6 minutes and the sleep efficiency was 76.0%. The total sleep time was 286.0 minutes.  Stage REM latency was 63.0 minutes.  The patient spent 1.05% of the night in stage N1 sleep, 79.37% in stage N2 sleep, 3.85% in stage N3 and 15.73% in REM.  Alpha intrusion was absent.  Supine sleep was 50.78%.  RESPIRATORY PARAMETERS The overall apnea/hypopnea index (AHI) was 15.3 per hour. There were 17 total apneas, including 17 obstructive, 0 central and 0 mixed apneas. There were 56 hypopneas and 0 RERAs.  The AHI during Stage REM sleep was 78.7 per hour.  AHI while supine was 20.7 per hour.  The mean  oxygen saturation was 95.55%. The minimum SpO2 during sleep was 83.00%.  moderate snoring was noted during this study.  CARDIAC DATA The 2 lead EKG demonstrated NSR. The mean heart rate was 66.12 beats per minute. Other EKG findings include: None.  LEG MOVEMENT DATA The total PLMS were 5 with a resulting PLMS index of 1.05. Associated arousal with leg movement index was 1.0 .  IMPRESSIONS - Moderate obstructive sleep apnea occurred during this study (AHI = 15.3/h). - No significant central sleep apnea occurred during this study (CAI = 0.0/h). - Mild oxygen desaturation was noted during this study (Min O2 = 83.00%). - The patient snored with moderate snoring volume. - No cardiac abnormalities were noted during this study. - Clinically significant periodic limb movements did not occur during sleep. No significant associated arousals.  DIAGNOSIS - Obstructive Sleep Apnea (327.23 [G47.33 ICD-10])  RECOMMENDATIONS - Therapeutic CPAP titration to determine optimal pressure required to alleviate sleep disordered breathing. - Positional therapy avoiding supine position during sleep. - Avoid alcohol, sedatives and other CNS depressants that may worsen sleep apnea and disrupt normal sleep architecture. - Sleep hygiene should be reviewed to assess factors that may improve sleep quality. - Weight management and regular exercise should be initiated or continued if appropriate.    Armanda Magicraci Una Yeomans Diplomate, American Board of Sleep Medicine  ELECTRONICALLY SIGNED ON:  07/05/2017, 8:32 PM [facility] PH: [phone]   FX: [phone] ACCREDITED BY THE AMERICAN ACADEMY OF SLEEP MEDICINE

## 2017-07-10 NOTE — Telephone Encounter (Signed)
Switch to home study  Deidre AlaHiggins, Amy C  Jasmon Mattice G, CMA        [place]alee North[place]ina, per Dr. Ladona Ridgelaylor, okay to switch pt to home study. Thanks, Amy     ----- Message -----  From: Marinus Mawaylor, Gregg W, MD  Sent: 07/02/2017  8:15 PM  To: Amy Pamella Pert Higgins  Subject: RE: Molli KnockOkay to switch to home sleep study?      Yes that would be fine. GT  ----- Message -----  From: Haywood FillerHiggins, Amy C  Sent: 06/29/2017  4:15 PM  To: Marinus MawGregg W Taylor, MD  Subject: Molli KnockOkay to switch to home sleep study?        Pt does not meet insurance criteria for in lab sle

## 2017-07-11 ENCOUNTER — Telehealth: Payer: Self-pay | Admitting: *Deleted

## 2017-07-11 DIAGNOSIS — G4733 Obstructive sleep apnea (adult) (pediatric): Secondary | ICD-10-CM

## 2017-07-11 NOTE — Telephone Encounter (Signed)
sleep study Savannah Nielsen, Jennifer A, RN  Reesa ChewJones, Michaelia Beilfuss G, CMA        Pt referred for sleep study by Dr. Ladona Ridgelaylor for daytime somnolence, fatigue and snoring. Sleep scale score 14.   :)  Dierdre HighmanJenny RN

## 2017-07-11 NOTE — Telephone Encounter (Signed)
Informed patient of sleep study results and patient understanding was verbalized. Patient understands she has sleep apnea and Dr Mayford Knifeurner recommends a CPAP titration. Patient understands [facility]. Patient understands she will receive a sleep packet in a week or so. Patient understands to call if she does not receive the sleep packet in a timely manner. Patient agrees with treatment and thanked me for call.

## 2017-07-11 NOTE — Telephone Encounter (Signed)
Informed patient of upcoming home sleep study and patient understanding was verbalized. Patient understands her sleep study will be done at HOME with [facility]. Patient understands she will receive a call in a week or so. Patient understands to call if she does not receive that call in a timely manner. Patient agrees with treatment and thanked me for call. All paperwork sent to [facility]

## 2017-07-11 NOTE — Telephone Encounter (Signed)
-----   Message from Quintella Reichertraci R Turner, MD sent at 07/05/2017  8:35 PM EST ----- Please let patient know that they have sleep apnea and recommend CPAP titration. Please set up titration in the sleep lab.

## 2017-07-31 NOTE — Telephone Encounter (Signed)
Sent to pre cert dept.

## 2017-08-01 ENCOUNTER — Encounter: Payer: Self-pay | Admitting: *Deleted

## 2017-08-01 NOTE — Telephone Encounter (Signed)
RE: Pre cert  Deidre AlaHiggins, Amy C  Deni Berti G, CMA        Pt is approved. Please schedule and let me know when and I will enter the precert notes. Thanks.     ----- Message -----  From: Reesa ChewJones, Atif Chapple G, CMA  Sent: 07/31/2017  1:54 PM  To: [facility]  Subject: Pre cert                     recommends a CPAP titration.

## 2017-08-01 NOTE — Telephone Encounter (Signed)
Informed patient of titration study and verbalized understanding was indicated. Patient understands her Titration study is scheduled for Tuesday August 15 2017. Patient understands [facility]. Patient understands she will receive a sleep packet in a week or so. Patient understands to call if she does not receive the sleep packet in a timely manner. Patient agrees with treatment and thanked me for call.

## 2017-08-04 ENCOUNTER — Ambulatory Visit: Payer: Self-pay | Admitting: Family Medicine

## 2017-08-04 ENCOUNTER — Ambulatory Visit: Payer: Self-pay | Admitting: *Deleted

## 2017-08-04 NOTE — Telephone Encounter (Signed)
Patient is calling with new bilateral leg edema. It is coming during the day- it tends to go down at night- but it comes back during the day when patient is up and moving around. It is progressively spreading from the ankles up to the knee.  Reason for Disposition . [1] MODERATE leg swelling (e.g., swelling extends up to knees) AND [2] new onset or worsening  Answer Assessment - Initial Assessment Questions 1. ONSET: "When did the swelling start?" (e.g., minutes, hours, days)     Last week- started left ankle- progressed and got worse- last couple days- both legs- goes at night- during the day comes back 2. LOCATION: "What part of the leg is swollen?"  "Are both legs swollen or just one leg?"     Bilateral- knee down 3. SEVERITY: "How bad is the swelling?" (e.g., localized; mild, moderate, severe)  - Localized - small area of swelling localized to one leg  - MILD pedal edema - swelling limited to foot and ankle, pitting edema < 1/4 inch (6 mm) deep, rest and elevation eliminate most or all swelling  - MODERATE edema - swelling of lower leg to knee, pitting edema > 1/4 inch (6 mm) deep, rest and elevation only partially reduce swelling  - SEVERE edema - swelling extends above knee, facial or hand swelling present      Moderate- +3 pitting 4. REDNESS: "Does the swelling look red or infected?"     No redness 5. PAIN: "Is the swelling painful to touch?" If so, ask: "How painful is it?"   (Scale 1-10; mild, moderate or severe)     Hurts due to tightness 6. FEVER: "Do you have a fever?" If so, ask: "What is it, how was it measured, and when did it start?"      no 7. CAUSE: "What do you think is causing the leg swelling?"     no 8. MEDICAL HISTORY: "Do you have a history of heart failure, kidney disease, liver failure, or cancer?"     Patient does have pacemaker 9. RECURRENT SYMPTOM: "Have you had leg swelling before?" If so, ask: "When was the last time?" "What happened that time?"     Used to  have a long time ago- but only in her ankles- medication-HCTZ 10. OTHER SYMPTOMS: "Do you have any other symptoms?" (e.g., chest pain, difficulty breathing)       no 11. PREGNANCY: "Is there any chance you are pregnant?" "When was your last menstrual period?"       n/a  Protocols used: LEG SWELLING AND EDEMA-A-AH

## 2017-08-05 ENCOUNTER — Ambulatory Visit (INDEPENDENT_AMBULATORY_CARE_PROVIDER_SITE_OTHER): Payer: BLUE CROSS/BLUE SHIELD | Admitting: Family Medicine

## 2017-08-05 ENCOUNTER — Encounter: Payer: Self-pay | Admitting: Family Medicine

## 2017-08-05 VITALS — BP 160/84 | HR 95 | Temp 97.6°F | Resp 16 | Ht 60.0 in | Wt 217.2 lb

## 2017-08-05 DIAGNOSIS — M7989 Other specified soft tissue disorders: Secondary | ICD-10-CM | POA: Diagnosis not present

## 2017-08-05 DIAGNOSIS — G5601 Carpal tunnel syndrome, right upper limb: Secondary | ICD-10-CM

## 2017-08-05 DIAGNOSIS — R000 Tachycardia, unspecified: Secondary | ICD-10-CM

## 2017-08-05 DIAGNOSIS — I495 Sick sinus syndrome: Secondary | ICD-10-CM | POA: Diagnosis not present

## 2017-08-05 DIAGNOSIS — R200 Anesthesia of skin: Secondary | ICD-10-CM | POA: Diagnosis not present

## 2017-08-05 DIAGNOSIS — R202 Paresthesia of skin: Secondary | ICD-10-CM

## 2017-08-05 NOTE — Progress Notes (Signed)
Subjective:    Patient ID: Savannah Nielsen, female    DOB: March 21, 1966, 52 y.o.   MRN: [id number]  HPI Savannah Nielsen is a 52 y.o. female Presents today for: Chief Complaint  Patient presents with  . Leg Swelling    bilateral 1 week  . Numbness    right hand for 1 week   New patient to me, followed by Dr. Creta Levin for diabetes with most recent visit in January. 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 Electrophysiology Dr. Ladona Ridgel, cardiology Dr. Anne Fu and takes metoprolol. At January visit with cardiology option of additional dose of metoprolol if she has tachycardia. Suspected atrial tachycardia.   Notes swelling in lower legs past 1 week. Swelling noted during the day, but improves overnight. Prior issue with swelling in legs a few years ago. No mention of previous CHF in med record and she does not know if this diagnosis either. Weight is increased over the past 2 months as noted below.  6 pounds since January. Noticed more swelling in legs up to knees past few days - ankle to knees, but improved as of right now. No known diet changes/salt intake change recently. No chest pains, no dyspnea. Feels like heart may be racing more this past week. No home readings on blood pressures, but no missed doses.   R hand feels numb and tingling at times (not all the time) over past week or so as well, no weakness. May have felt some swelling in hand as well. Tingling in center of fingers to thumb. Hx of carpal tunnel syndrome, with injections in past.   Wt Readings from Last 3 Encounters:  08/05/17 217 lb 3.2 oz (98.5 kg)  07/03/17 210 lb (95.3 kg)  06/15/17 211 lb (95.7 kg)    Patient Active Problem List   Diagnosis Date Noted  . Hypertension   . Diabetes mellitus without complication [facility])    Past  Medical History:  Diagnosis Date  . Diabetes mellitus without complication (HCC)   . GERD (gastroesophageal reflux disease)   . Hypertension    Past Surgical History:  Procedure Laterality Date  . PACEMAKER IMPLANT     Biotronik  . TUBAL LIGATION     Allergies  Allergen Reactions  . Betadine [Povidone Iodine] Hives and Rash   Prior to Admission medications   Medication Sig Start Date End Date Taking? Authorizing Provider  Ascorbic Acid (VITAMIN C PO) Take 1 tablet by mouth daily.   Yes [provider]  aspirin EC 81 MG tablet Take 81 mg by mouth daily.   Yes [provider]  Cholecalciferol (VITAMIN D-3 PO) Take 1 capsule by mouth daily.   Yes [provider]  Cyanocobalamin (VITAMIN B-12 PO) Take 1 tablet by mouth daily.   Yes [provider]  LANTUS 100 UNIT/ML injection Inject 0.4 mLs (40 Units total) into the skin at bedtime. 06/15/17  Yes Stallings, Zoe A, MD  losartan-hydrochlorothiazide (HYZAAR) 100-25 MG tablet Take 1 tablet by mouth daily. 06/15/17  Yes Doristine Bosworth, MD  metFORMIN (GLUCOPHAGE-XR) 500 MG 24 hr tablet Take 1000mg  (2 tabs) in the morning and one 500mg  in the evening 06/15/17  Yes Stallings, Zoe A, MD  metoprolol tartrate (LOPRESSOR) 25 MG tablet Take 1 tablet (25 mg total) by mouth 2 (two) times daily. Or as needed 06/01/17  Yes Jake Bathe, MD  Multiple Vitamins-Calcium (ONE-A-DAY WOMENS FORMULA PO) Take 1 tablet by mouth daily.   Yes [provider]  naproxen sodium (ALEVE) 220 MG tablet Take 440 mg by mouth daily with breakfast.   Yes [provider]  Omega-3 Fatty Acids (OMEGA-3 FISH OIL CONCENTRATE PO) Take 1 capsule by mouth daily.   Yes [provider]  ranitidine (ZANTAC) 150 MG tablet Take 150 mg by mouth 2 (two) times daily.   Yes [provider]   Social History   Socioeconomic History  . Marital status: Divorced    Spouse name: Not on file  . Number of children: Not on file   . Years of education: Not on file  . Highest education level: Not on file  Social Needs  . Financial resource strain: Not on file  . Food insecurity - worry: Not on file  . Food insecurity - inability: Not on file  . Transportation needs - medical: Not on file  . Transportation needs - non-medical: Not on file  Occupational History  . Not on file  Tobacco Use  . Smoking status: Former Smoker    Last attempt to quit: 05/30/1994    Years since quitting: 23.2  . Smokeless tobacco: Never Used  Substance and Sexual Activity  . Alcohol use: No    Frequency: Never  . Drug use: No  . Sexual activity: No  Other Topics Concern  . Not on file  Social History Narrative  . Not on file    Review of Systems  Constitutional: Negative for chills and fever.  Respiratory: Negative for shortness of breath and wheezing.   Cardiovascular: Negative for chest pain.       Objective:   Physical Exam  Constitutional: She is oriented to person, place, and time. She appears well-developed and well-nourished.  HENT:  Head: Normocephalic and atraumatic.  Eyes: Conjunctivae and EOM are normal. Pupils are equal, round, and reactive to light.  Neck: Carotid bruit is not present.  Cardiovascular: Normal rate, regular rhythm, normal heart sounds and intact distal pulses.  Pulmonary/Chest: Effort normal and breath sounds normal.  Abdominal: Soft. She exhibits no pulsatile midline mass. There is no tenderness.  Musculoskeletal: She exhibits edema (1+ edema to mid/proximal tibia. No stasis changes or ulceration,).       Right wrist: She exhibits no bony tenderness.  Positive Tinel's and mildly positive Phalen's on right no weakness, no apparent hand swelling  Neurological: She is alert and oriented to person, place, and time.  Skin: Skin is warm and dry.  Psychiatric: She has a normal mood and affect. Her behavior is normal.  Vitals reviewed.   Vitals:   08/05/17 1337  BP: (!) 160/84  Pulse: 95    Resp: 16  Temp: 97.6 F (36.4 C)  TempSrc: Oral  SpO2: 100%  Weight: 217 lb 3.2 oz (98.5 kg)  Height: 5' (1.524 m)   EKG: Paced rhythm, rate 68.    Assessment & Plan:   Savannah Nielsen is a 52 y.o. female Leg swelling - Plan: Basic metabolic panel, Pro b natriuretic peptide, CBC Tachy-brady syndrome (HCC) - Plan: EKG 12-Lead Racing heart beat - Plan: EKG 12-Lead, TSH, Pro b natriuretic peptide  - hx of tachy-brady syndrome, but paced rhythm on EKG. Lungs clear, no known hx of CHF. Improves overnight - reassuring.   - check BNP, BMP. Elevate legs when seated, monitor sodium intake. Elevate legs when seated. Handout given. Recheck 1 week -  sooner if worse.   Numbness and tingling in right hand - Plan: TSH Carpal tunnel syndrome of right wrist - Plan: Splint wrist  - Exam and symptoms consistent with carpal tunnel syndrome, similar in past. Wrist splint applied, handout given.  RTC precautions if persistent, may need to meet with hand specialist. TSH obtained, but less likely cause.   No orders of the defined types were placed in this encounter.  Patient Instructions   R hand symptoms appear to be due to carpal tunnel syndrome. See info below. You can try the wrist temporarily, then follow up in next 2 weeks to see if hand specialist evaluation needed.   I will check some electrolytes for your swelling in the legs, but see information below on peripheral edema. Make sure to watch out for extra salt/sodium in the diet, elevate legs when seated. If any shortness of breath or worsening symptoms return here or emergency room, including if any worsening symptoms of fast heart rate. Either way recheck within the next 1 week with myself or Dr. Creta LevinStallings   Carpal Tunnel Syndrome Carpal tunnel syndrome is a condition that causes pain in your hand and arm. The carpal tunnel is a narrow area located on the palm side of your wrist. Repeated wrist motion or certain diseases may cause  swelling within the tunnel. This swelling pinches the main nerve in the wrist (median nerve). What are the causes? This condition may be caused by:  Repeated wrist motions.  Wrist injuries.  Arthritis.  A cyst or tumor in the carpal tunnel.  Fluid buildup during pregnancy.  Sometimes the cause of this condition is not known. What increases the risk? This condition is more likely to develop in:  People who have jobs that cause them to repeatedly move their wrists in the same motion, such as Health visitorbutchers and cashiers.  Women.  People with certain conditions, such as: ? Diabetes. ? Obesity. ? An underactive thyroid (hypothyroidism). ? Kidney failure.  What are the signs or symptoms? Symptoms of this condition include:  A tingling feeling in your fingers, especially in your thumb, index, and middle fingers.  Tingling or numbness in your hand.  An aching feeling in your entire arm, especially when your wrist and elbow are bent for long periods of time.  Wrist pain that goes up your arm to your shoulder.  Pain that goes down into your palm or fingers.  A weak feeling in your hands. You may have trouble grabbing and holding items.  Your symptoms may feel worse during the night. How is this diagnosed? This condition is diagnosed with a medical history and physical exam. You may also have tests, including:  An electromyogram (EMG). This test measures electrical signals sent by your nerves into the muscles.  X-rays.  How is this treated? Treatment for this condition includes:  Lifestyle changes. It is important to stop doing or modify the activity that caused your condition.  Physical or occupational therapy.  Medicines for pain and inflammation. This may include medicine that is injected into your wrist.  A wrist splint.  Surgery.  Follow these instructions at home: If you have a splint:  Wear it as told by your health care provider. Remove it only as told by  your health care provider.  Loosen the splint if your fingers become numb and tingle, or if they turn cold and blue.  Keep the splint clean and dry. General instructions  Take over-the-counter and prescription medicines only as told by  your health care provider.  Rest your wrist from any activity that may be causing your pain. If your condition is work related, talk to your employer about changes that can be made, such as getting a wrist pad to use while typing.  If directed, apply ice to the painful area: ? Put ice in a plastic bag. ? Place a towel between your skin and the bag. ? Leave the ice on for 20 minutes, 2-3 times per day.  Keep all follow-up visits as told by your health care provider. This is important.  Do any exercises as told by your health care provider, physical therapist, or occupational therapist. Contact a health care provider if:  You have new symptoms.  Your pain is not controlled with medicines.  Your symptoms get worse. This information is not intended to replace advice given to you by your health care provider. Make sure you discuss any questions you have with your health care provider. Document Released: 05/13/2000 Document Revised: 09/24/2015 Document Reviewed: 10/01/2014 Elsevier Interactive Patient Education  2018 [employer].    IF you received an x-ray today, you will receive an invoice from [facility]. Please contact [facility] at [phone] with questions or concerns regarding your invoice.   IF you received labwork today, you will receive an invoice from [place]. Please contact [facility] at [phone] with questions or concerns regarding your invoice.   Our billing staff will not be able to assist you with questions regarding bills from these companies.  You will be contacted with the lab results as soon as they are available. The fastest way to get your results is to activate your My Chart account. Instructions are  located on the last page of this paperwork. If you have not heard from [place] regarding the results in 2 weeks, please contact this office.       Signed,   Meredith Staggers, MD [facility] at [facility].  08/08/17 8:14 AM

## 2017-08-05 NOTE — Patient Instructions (Addendum)
R hand symptoms appear to be due to carpal tunnel syndrome. See info below. You can try the wrist temporarily, then follow up in next 2 weeks to see if hand specialist evaluation needed.   I will check some electrolytes for your swelling in the legs, but see information below on peripheral edema. Make sure to watch out for extra salt/sodium in the diet, elevate legs when seated. If any shortness of breath or worsening symptoms return here or emergency room, including if any worsening symptoms of fast heart rate. Either way recheck within the next 1 week with myself or Dr. Creta Levin   Carpal Tunnel Syndrome Carpal tunnel syndrome is a condition that causes pain in your hand and arm. The carpal tunnel is a narrow area located on the palm side of your wrist. Repeated wrist motion or certain diseases may cause swelling within the tunnel. This swelling pinches the main nerve in the wrist (median nerve). What are the causes? This condition may be caused by:  Repeated wrist motions.  Wrist injuries.  Arthritis.  A cyst or tumor in the carpal tunnel.  Fluid buildup during pregnancy.  Sometimes the cause of this condition is not known. What increases the risk? This condition is more likely to develop in:  People who have jobs that cause them to repeatedly move their wrists in the same motion, such as Health visitor.  Women.  People with certain conditions, such as: ? Diabetes. ? Obesity. ? An underactive thyroid (hypothyroidism). ? Kidney failure.  What are the signs or symptoms? Symptoms of this condition include:  A tingling feeling in your fingers, especially in your thumb, index, and middle fingers.  Tingling or numbness in your hand.  An aching feeling in your entire arm, especially when your wrist and elbow are bent for long periods of time.  Wrist pain that goes up your arm to your shoulder.  Pain that goes down into your palm or fingers.  A weak feeling in your  hands. You may have trouble grabbing and holding items.  Your symptoms may feel worse during the night. How is this diagnosed? This condition is diagnosed with a medical history and physical exam. You may also have tests, including:  An electromyogram (EMG). This test measures electrical signals sent by your nerves into the muscles.  X-rays.  How is this treated? Treatment for this condition includes:  Lifestyle changes. It is important to stop doing or modify the activity that caused your condition.  Physical or occupational therapy.  Medicines for pain and inflammation. This may include medicine that is injected into your wrist.  A wrist splint.  Surgery.  Follow these instructions at home: If you have a splint:  Wear it as told by your health care provider. Remove it only as told by your health care provider.  Loosen the splint if your fingers become numb and tingle, or if they turn cold and blue.  Keep the splint clean and dry. General instructions  Take over-the-counter and prescription medicines only as told by your health care provider.  Rest your wrist from any activity that may be causing your pain. If your condition is work related, talk to your employer about changes that can be made, such as getting a wrist pad to use while typing.  If directed, apply ice to the painful area: ? Put ice in a plastic bag. ? Place a towel between your skin and the bag. ? Leave the ice on for 20 minutes, 2-3 times  per day.  Keep all follow-up visits as told by your health care provider. This is important.  Do any exercises as told by your health care provider, physical therapist, or occupational therapist. Contact a health care provider if:  You have new symptoms.  Your pain is not controlled with medicines.  Your symptoms get worse. This information is not intended to replace advice given to you by your health care provider. Make sure you discuss any questions you have  with your health care provider. Document Released: 05/13/2000 Document Revised: 09/24/2015 Document Reviewed: 10/01/2014 Elsevier Interactive Patient Education  2018 [employer]    IF you received an x-ray today, you will receive an invoice from [facility]. Please contact [facility] at [phone] with questions or concerns regarding your invoice.   IF you received labwork today, you will receive an invoice from [facility]. Please contact [facility] at [phone] with questions or concerns regarding your invoice.   Our billing staff will not be able to assist you with questions regarding bills from these companies.  You will be contacted with the lab results as soon as they are available. The fastest way to get your results is to activate your My Chart account. Instructions are located on the last page of this paperwork. If you have not heard from us regarding the results in 2 weeks, please contact this office.

## 2017-08-06 LAB — BASIC METABOLIC PANEL
BUN/Creatinine Ratio: 9 (ref 9–23)
BUN: 10 mg/dL (ref 6–24)
CO2: 26 mmol/L (ref 20–29)
Calcium: 9.6 mg/dL (ref 8.7–10.2)
Chloride: 101 mmol/L (ref 96–106)
Creatinine, Ser: 1.08 mg/dL — ABNORMAL HIGH (ref 0.57–1.00)
GFR, EST AFRICAN AMERICAN: 68 mL/min/{1.73_m2} (ref >59)
GFR, EST NON AFRICAN AMERICAN: 59 mL/min/{1.73_m2} — AB (ref >59)
Glucose: 163 mg/dL — ABNORMAL HIGH (ref 65–99)
POTASSIUM: 4.2 mmol/L (ref 3.5–5.2)
SODIUM: 142 mmol/L (ref 134–144)

## 2017-08-06 LAB — CBC
HEMATOCRIT: 38.6 % (ref 34.0–46.6)
HEMOGLOBIN: 11.8 g/dL (ref 11.1–15.9)
MCH: 21.8 pg — ABNORMAL LOW (ref 26.6–33.0)
MCHC: 30.6 g/dL — AB (ref 31.5–35.7)
MCV: 71 fL — AB (ref 79–97)
Platelets: 301 10*3/uL (ref 150–379)
RBC: 5.42 x10E6/uL — ABNORMAL HIGH (ref 3.77–5.28)
RDW: 16.2 % — AB (ref 12.3–15.4)
WBC: 9.2 10*3/uL (ref 3.4–10.8)

## 2017-08-06 LAB — TSH: TSH: 1.52 u[IU]/mL (ref 0.450–4.500)

## 2017-08-06 LAB — PRO B NATRIURETIC PEPTIDE: NT-Pro BNP: 629 pg/mL — ABNORMAL HIGH (ref 0–249)

## 2017-08-08 ENCOUNTER — Telehealth: Payer: Self-pay

## 2017-08-08 NOTE — Telephone Encounter (Signed)
Spoke with Mrs. Savannah CanalesMarilyn Nielsen this morning about her abnormal labs, she was informed that her lab test for heart Failure came back abnormal/elavated. She verbalized understanding. Also informed pt that I have scheduled her an appointment for 08/10/2017 at her Cardiologist at 940am.  She verbalized understanding.

## 2017-08-10 ENCOUNTER — Encounter: Payer: Self-pay | Admitting: Cardiology

## 2017-08-10 ENCOUNTER — Ambulatory Visit: Payer: BLUE CROSS/BLUE SHIELD | Admitting: Cardiology

## 2017-08-10 VITALS — BP 126/84 | HR 80 | Ht 60.0 in | Wt 209.2 lb

## 2017-08-10 DIAGNOSIS — R0602 Shortness of breath: Secondary | ICD-10-CM

## 2017-08-10 DIAGNOSIS — E119 Type 2 diabetes mellitus without complications: Secondary | ICD-10-CM | POA: Diagnosis not present

## 2017-08-10 DIAGNOSIS — I10 Essential (primary) hypertension: Secondary | ICD-10-CM | POA: Diagnosis not present

## 2017-08-10 MED ORDER — LOSARTAN POTASSIUM 100 MG PO TABS: 100.0000 mg | ORAL_TABLET | Freq: Every day | ORAL | 3 refills | Status: AC

## 2017-08-10 MED ORDER — FUROSEMIDE 20 MG PO TABS: 20.0000 mg | ORAL_TABLET | Freq: Every day | ORAL | 3 refills | Status: AC

## 2017-08-10 MED FILL — LOSARTAN POTASSIUM 100 MG T: 100 | 90 days supply | Qty: 90 | Fill #0

## 2017-08-10 MED FILL — FUROSEMIDE 20 MG TABS: 20 | 90 days supply | Qty: 90 | Fill #0

## 2017-08-10 NOTE — Progress Notes (Signed)
Cardiology Office Note:    Date:  08/10/2017   ID:  Savannah Nielsen, DOB [birth year], MRN [id number]  PCP:  Savannah Bosworth, MD  Cardiologist:  Savannah Schultz, MD   Referring MD: Savannah Bosworth, MD     History of Present Illness:    Savannah Nielsen is a 52 y.o. female here for follow up.  She was seen in the emergency department on 05/13/17 with prior history of tachybradycardia syndrome status post pacemaker then with episode of palpitations shortness of breath upper back pain for about 45 minutes.  She did not have any chest pain.  D-dimer and serial troponins were negative.  Pacemaker interrogation showed sinus tachycardia.  No ventricular tachycardia or ventricular fibrillation.  She has a Biotronik pacemaker implanted in April 2018 placed in [place].  HR 120 then 60. Happened when walking in [place]. Fixed atrial lead.   04/2016 - working in [place], walking hall, lighteaded. Sat down. BP 183/120, BS 389. To ER. A1c 14.2. Losartan.  She did end up seeing a cardiologist there who stated that she likely need a pacemaker.  He placed a 30-day event monitor and she had 2 significant pauses 1 3 seconds and 1 3.5 seconds.  1 of them was at night and 1 of them was during the day when she was sitting down she felt a fuzzy feeling and thought her blood sugar was abnormal.  It was not.  She was then called and she was told that she had a pause.  Pacer needed. Maybe had syncope as child.   08/10/17 -she is noticed some labs swelling, BNP was elevated.  Had some shortness of breath. Trying to get used. No CP. No syncope. No orthopnea. GERD for no lay flat. Feels a pressure.  We are going to change her Hyzaar over to losartan and give her furosemide, see below.  Checking an echocardiogram.  No tob. Mother died MI, CABG 2 years before. Father MI, CVA.   She is a traveling Engineer, civil (consulting), LPN.  Last job was in [place].  Works primarily with geriatrics. Lives in [place].      Past  Medical History:  Diagnosis Date  . Diabetes mellitus without complication (HCC)   . GERD (gastroesophageal reflux disease)   . Hypertension     Past Surgical History:  Procedure Laterality Date  . PACEMAKER IMPLANT     Biotronik  . TUBAL LIGATION      Current Medications: Current Meds  Medication Sig  . Ascorbic Acid (VITAMIN C PO) Take 1 tablet by mouth daily.  Marland Kitchen aspirin EC 81 MG tablet Take 81 mg by mouth daily.  . Cholecalciferol (VITAMIN D-3 PO) Take 1 capsule by mouth daily.  . Cyanocobalamin (VITAMIN B-12 PO) Take 1 tablet by mouth daily.  Marland Kitchen LANTUS 100 UNIT/ML injection Inject 0.4 mLs (40 Units total) into the skin at bedtime.  . metFORMIN (GLUCOPHAGE-XR) 500 MG 24 hr tablet Take 1000mg  (2 tabs) in the morning and one 500mg  in the evening  . metoprolol tartrate (LOPRESSOR) 25 MG tablet Take 1 tablet (25 mg total) by mouth 2 (two) times daily. Or as needed  . Multiple Vitamins-Calcium (ONE-A-DAY WOMENS FORMULA PO) Take 1 tablet by mouth daily.  . naproxen sodium (ALEVE) 220 MG tablet Take 440 mg by mouth daily with breakfast.  . Omega-3 Fatty Acids (OMEGA-3 FISH OIL CONCENTRATE PO) Take 1 capsule by mouth daily.  . ranitidine (ZANTAC) 150 MG tablet Take 150 mg by mouth 2 (two) times daily.  . [  DISCONTINUED] losartan-hydrochlorothiazide (HYZAAR) 100-25 MG tablet Take 1 tablet by mouth daily.     Allergies:   Betadine [povidone iodine]   Social History   Socioeconomic History  . Marital status: Divorced    Spouse name: None  . Number of children: None  . Years of education: None  . Highest education level: None  Social Needs  . Financial resource strain: None  . Food insecurity - worry: None  . Food insecurity - inability: None  . Transportation needs - medical: None  . Transportation needs - non-medical: None  Occupational History  . None  Tobacco Use  . Smoking status: Former Smoker    Last attempt to quit: 05/30/1994    Years since quitting: 23.2  . Smokeless  tobacco: Never Used  Substance and Sexual Activity  . Alcohol use: No    Frequency: Never  . Drug use: No  . Sexual activity: No  Other Topics Concern  . None  Social History Narrative  . None     Family History: Mother with MI, CABG  ROS:   Please see the history of present illness.    All other review of systems negative.    EKGs/Labs/Other Studies Reviewed:    The following studies were reviewed today: Prior lab work, EKG, office notes reviewed  EKG:  EKG is ordered today.  The ekg ordered today demonstrates AV pacing  Recent Labs: 08/05/2017: BUN 10; Creatinine, Ser 1.08; Hemoglobin 11.8; NT-Pro BNP 629; Platelets 301; Potassium 4.2; Sodium 142; TSH 1.520  Recent Lipid Panel No results found for: CHOL, TRIG, HDL, CHOLHDL, VLDL, LDLCALC, LDLDIRECT  Physical Exam:    VS:  BP 126/84   Pulse 80   Ht 5' (1.524 m)   Wt 209 lb 3.2 oz (94.9 kg)   SpO2 97%   BMI 40.86 kg/m     Wt Readings from Last 3 Encounters:  08/10/17 209 lb 3.2 oz (94.9 kg)  08/05/17 217 lb 3.2 oz (98.5 kg)  07/03/17 210 lb (95.3 kg)     GEN: Well nourished, well developed, in no acute distress  HEENT: normal  Neck: no JVD, carotid bruits, or masses Cardiac: RRR; no murmurs, rubs, or gallops,no edema  Respiratory:  clear to auscultation bilaterally, normal work of breathing GI: soft, nontender, nondistended, + BS MS: no deformity or atrophy  Skin: warm and dry, no rash Neuro:  Alert and Oriented x 3, Strength and sensation are intact Psych: euthymic mood, full affect   ASSESSMENT:    1. Essential hypertension   2. Shortness of breath   3. Morbid obesity (HCC)   4. Diabetes mellitus with coincident hypertension (HCC)    PLAN:    In order of problems listed above:  Elevated BNP of 600/dyspnea  -Nothing is significantly change in her symptoms however her BNP was elevated, she is short of breath when moving around.  We will check an echocardiogram.  I will also change her over to  Lasix.  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  She states that it was recalibrated a proximally 1 month after implant in [place].  Sounds like she has a history of tachycardia/bradycardia syndrome.  Continue with metoprolol 25 mg twice a day and she may take an extra 25 mg as needed tachycardia.  The way she describes her tacky arrhythmia it sounds like she was going 120 bpm suddenly and then suddenly dropping back down to 60.  Could be atrial  tachycardia.  Stable, no changes with this.  Diabetes with hypertension -Much better control with insulin.  Still with hemoglobin A1c of 10.  Continue to work with primary care doctor.  Morbid obesity -Continue to work on weight loss.  BMI greater than 40.  Medication Adjustments/Labs and Tests Ordered: Current medicines are reviewed at length with the patient today.  Concerns regarding medicines are outlined above.  Orders Placed This Encounter  Procedures  . ECHOCARDIOGRAM COMPLETE   Meds ordered this encounter  Medications  . furosemide (LASIX) 20 MG tablet    Sig: Take 1 tablet (20 mg total) by mouth daily.    Dispense:  90 tablet    Refill:  3  . losartan (COZAAR) 100 MG tablet    Sig: Take 1 tablet (100 mg total) by mouth daily.    Dispense:  90 tablet    Refill:  3    Signed, Savannah Schultz, MD  08/10/2017 10:16 AM    [facility]

## 2017-08-10 NOTE — Patient Instructions (Signed)
Medication Instructions:  Please discontinue your Hyzaar. Start Losartan 100 mg a day and Furosemide 20 mg a day. Continue all other medications as listed.  Testing/Procedures: Your physician has requested that you have an echocardiogram. Echocardiography is a painless test that uses sound waves to create images of your heart. It provides your doctor with information about the size and shape of your heart and how well your heart's chambers and valves are working. This procedure takes approximately one hour. There are no restrictions for this procedure.  Follow-Up: Follow up in 6 months with Nada BoozerLaura Ingold, NP You will receive a letter in the mail 2 months before you are due.  Please call us when you receive this letter to schedule your follow up appointment.  Follow up in 1 year with Dr. Anne FuSkains.  You will receive a letter in the mail 2 months before you are due.  Please call us when you receive this letter to schedule your follow up appointment.  If you need a refill on your cardiac medications before your next appointment, please call your pharmacy.  Thank you for choosing [facility]!!

## 2017-08-15 ENCOUNTER — Ambulatory Visit (HOSPITAL_BASED_OUTPATIENT_CLINIC_OR_DEPARTMENT_OTHER): Payer: BLUE CROSS/BLUE SHIELD | Attending: Cardiology | Admitting: Cardiology

## 2017-08-15 DIAGNOSIS — G4733 Obstructive sleep apnea (adult) (pediatric): Secondary | ICD-10-CM | POA: Insufficient documentation

## 2017-08-16 NOTE — Procedures (Signed)
   NAME: Savannah SkeansMarilyn Louise Nielsen DATE OF BIRTH:  03/15/1966 MEDICAL RECORD NUMBER [id number]  LOCATION: [facility]  PHYSICIAN: Lambert Jeanty  DATE OF STUDY: 08/15/2017  SLEEP STUDY TYPE: Positive Airway Pressure Titration               REFERRING PHYSICIAN: Quintella Reicherturner, Mehar Sagen R, MD   Gender: Female D.O.B: May 28, 1966 Age (years): [age] Referring Provider: Armanda Magicraci Elise Knobloch MD, ABSM Height (inches): 60 Interpreting Physician: Armanda Magicraci Shavaun Osterloh MD, ABSM Weight (lbs): 210 RPSGT: Shelah LewandowskyGregory, Kenyon BMI: 41 MRN: [id number] Neck Size: 15.50  CLINICAL INFORMATION The patient is referred for a CPAP titration to treat sleep apnea.  SLEEP STUDY TECHNIQUE As per the AASM Manual for the Scoring of Sleep and Associated Events v2.3 (April 2016) with a hypopnea requiring 4% desaturations.  The channels recorded and monitored were frontal, central and occipital EEG, electrooculogram (EOG), submentalis EMG (chin), nasal and oral airflow, thoracic and abdominal wall motion, anterior tibialis EMG, snore microphone, electrocardiogram, and pulse oximetry. Continuous positive airway pressure (CPAP) was initiated at the beginning of the study and titrated to treat sleep-disordered breathing.  MEDICATIONS Medications self-administered by patient taken the night of the study : METOPROLOL, METFORMIN, LANTUS  TECHNICIAN COMMENTS Comments added by technician: Patient had difficulty initiating sleep. Comments added by scorer: N/A  RESPIRATORY PARAMETERS Optimal PAP Pressure (cm): 12  AHI at Optimal Pressure (/hr):0.0 Overall Minimal O2 (%):94.0  Supine % at Optimal Pressure (%):11 Minimal O2 at Optimal Pressure (%): 95.0   SLEEP ARCHITECTURE The study was initiated at 10:40:45 PM and ended at 5:26:08 AM.  Sleep onset time was 63.8 minutes and the sleep efficiency was 80.2%%. The total sleep time was 325.0 minutes.  The patient spent 2.8%% of the night in stage N1 sleep, 73.2%% in stage N2 sleep,  0.0%% in stage N3 and 24.00% in REM.Stage REM latency was 57.5 minutes  Wake after sleep onset was 16.6. Alpha intrusion was absent. Supine sleep was 44.82%.  CARDIAC DATA The 2 lead EKG demonstrated pacemaker generated. The mean heart rate was 68.3 beats per minute. Other EKG findings include: PPM.  LEG MOVEMENT DATA The total Periodic Limb Movements of Sleep (PLMS) were 0. The PLMS index was 0.0. A PLMS index of <15 is considered normal in adults.  IMPRESSIONS - The optimal PAP pressure was 12 cm of water. - Central sleep apnea was not noted during this titration (CAI = 0.6/h). - Significant oxygen desaturations were not observed during this titration (min O2 = 94.0%). - The patient snored with moderate snoring volume during this titration study. - Pacemaker rhythm was observed during this study. - Clinically significant periodic limb movements were not noted during this study. Arousals associated with PLMs were rare.  DIAGNOSIS - Obstructive Sleep Apnea (327.23 [G47.33 ICD-10])  RECOMMENDATIONS - Trial of CPAP therapy on 12 cm H2O with a Small size Fisher&Paykel Full Face Mask Simplus mask and heated humidification. - Avoid alcohol, sedatives and other CNS depressants that may worsen sleep apnea and disrupt normal sleep architecture. - Sleep hygiene should be reviewed to assess factors that may improve sleep quality. - Weight management and regular exercise should be initiated or continued. - Return to Sleep Center for re-evaluation after 10 weeks of therapy  Armanda Magicraci Terrion Gencarelli Diplomate, American Board of Sleep Medicine  ELECTRONICALLY SIGNED ON:  08/16/2017, 10:10 AM [facility] PH: [phone]   FX: [phone] ACCREDITED BY THE AMERICAN ACADEMY OF SLEEP MEDICINE

## 2017-08-17 ENCOUNTER — Ambulatory Visit (HOSPITAL_COMMUNITY): Payer: BLUE CROSS/BLUE SHIELD

## 2017-08-18 ENCOUNTER — Other Ambulatory Visit: Payer: Self-pay

## 2017-08-18 ENCOUNTER — Ambulatory Visit (HOSPITAL_COMMUNITY): Payer: BLUE CROSS/BLUE SHIELD | Attending: Cardiovascular Disease

## 2017-08-18 DIAGNOSIS — R002 Palpitations: Secondary | ICD-10-CM | POA: Diagnosis not present

## 2017-08-18 DIAGNOSIS — E669 Obesity, unspecified: Secondary | ICD-10-CM | POA: Diagnosis not present

## 2017-08-18 DIAGNOSIS — I10 Essential (primary) hypertension: Secondary | ICD-10-CM

## 2017-08-18 DIAGNOSIS — I119 Hypertensive heart disease without heart failure: Secondary | ICD-10-CM | POA: Diagnosis not present

## 2017-08-18 DIAGNOSIS — Z87891 Personal history of nicotine dependence: Secondary | ICD-10-CM | POA: Diagnosis not present

## 2017-08-18 DIAGNOSIS — E119 Type 2 diabetes mellitus without complications: Secondary | ICD-10-CM | POA: Insufficient documentation

## 2017-08-18 DIAGNOSIS — R0602 Shortness of breath: Secondary | ICD-10-CM | POA: Insufficient documentation

## 2017-08-18 DIAGNOSIS — Z8249 Family history of ischemic heart disease and other diseases of the circulatory system: Secondary | ICD-10-CM | POA: Insufficient documentation

## 2017-08-24 ENCOUNTER — Telehealth: Payer: Self-pay | Admitting: *Deleted

## 2017-08-24 NOTE — Telephone Encounter (Signed)
Called sleep results lmtcb. 

## 2017-08-24 NOTE — Telephone Encounter (Signed)
-----   Message from Quintella Reichertraci R Turner, MD sent at 08/16/2017 10:13 AM EDT ----- Please let patient know that they had a successful PAP titration and let DME know that orders are in EPIC.  Please set up 10 week OV with me.

## 2017-08-25 ENCOUNTER — Telehealth: Payer: Self-pay | Admitting: *Deleted

## 2017-08-25 NOTE — Telephone Encounter (Signed)
Called results lmtcb on cell. 

## 2017-08-25 NOTE — Telephone Encounter (Signed)
-----   Message from Traci R Turner, MD sent at 08/16/2017 10:13 AM EDT ----- Please let patient know that they had a successful PAP titration and let DME know that orders are in EPIC.  Please set up 10 week OV with me.  

## 2017-08-30 NOTE — Telephone Encounter (Signed)
Informed patient of titration results and verbalized understanding was indicated. Patient understands her titration was successful and doctor turner has ordered her a CPAP. Patient understands she will be contacted by [facility] to set up her cpap. Patient understands to call if [facility] does not contact her with new setup in a timely manner. Patient understands they will be called once confirmation has been received from [facility] that they have received their new machine to schedule 10 week follow up appointment.  [facility] notified of new cpap order  Please add to airview Patient was grateful for the call and thanked me.

## 2017-08-30 NOTE — Telephone Encounter (Signed)
Called results lmtcb on cell 08/30/17

## 2017-08-30 NOTE — Telephone Encounter (Signed)
  Turner, Traci R, MD  Marbella Markgraf G, CMA        Please let patient know that they had a successful PAP titration and let DME know that orders are in EPIC. Please set up 10 week OV with me.     

## 2017-09-12 ENCOUNTER — Ambulatory Visit (INDEPENDENT_AMBULATORY_CARE_PROVIDER_SITE_OTHER): Payer: Self-pay | Admitting: *Deleted

## 2017-09-12 DIAGNOSIS — I495 Sick sinus syndrome: Secondary | ICD-10-CM

## 2017-09-15 ENCOUNTER — Encounter: Payer: Self-pay | Admitting: Cardiology

## 2017-09-15 NOTE — Progress Notes (Signed)
Remote pacemaker transmission.   

## 2017-09-26 LAB — CUP PACEART REMOTE DEVICE CHECK
Brady Statistic AP VP Percent: 89 %
Brady Statistic AS VS Percent: 0 %
Brady Statistic RA Percent Paced: 90 %
Brady Statistic RV Percent Paced: 98 %
Date Time Interrogation Session: 20190429142744
Implantable Pulse Generator Implant Date: 20180411
Lead Channel Impedance Value: 486 Ohm
Lead Channel Impedance Value: 546 Ohm
Lead Channel Pacing Threshold Pulse Width: 0.4 ms
Lead Channel Setting Pacing Pulse Width: 0.4 ms
MDC IDC MSMT BATTERY REMAINING PERCENTAGE: 90 %
MDC IDC MSMT LEADCHNL RA PACING THRESHOLD AMPLITUDE: 0.7 V
MDC IDC MSMT LEADCHNL RA PACING THRESHOLD PULSEWIDTH: 0.4 ms
MDC IDC MSMT LEADCHNL RV PACING THRESHOLD AMPLITUDE: 0.8 V
MDC IDC SET LEADCHNL RA PACING AMPLITUDE: 1.7 V
MDC IDC SET LEADCHNL RV PACING AMPLITUDE: 1.4 V
MDC IDC STAT BRADY AP VS PERCENT: 0 %
MDC IDC STAT BRADY AS VP PERCENT: 10 %
Pulse Gen Model: 407145
Pulse Gen Serial Number: 69007337

## 2017-12-05 ENCOUNTER — Emergency Department (HOSPITAL_COMMUNITY): Payer: Self-pay

## 2017-12-05 ENCOUNTER — Emergency Department (HOSPITAL_COMMUNITY)
Admission: EM | Admit: 2017-12-05 | Discharge: 2017-12-06 | Disposition: A | Discharge status: Home / Self Care | Payer: Self-pay | Attending: Emergency Medicine | Admitting: Emergency Medicine

## 2017-12-05 DIAGNOSIS — Z7982 Long term (current) use of aspirin: Secondary | ICD-10-CM | POA: Insufficient documentation

## 2017-12-05 DIAGNOSIS — Z87891 Personal history of nicotine dependence: Secondary | ICD-10-CM | POA: Insufficient documentation

## 2017-12-05 DIAGNOSIS — R55 Syncope and collapse: Secondary | ICD-10-CM | POA: Insufficient documentation

## 2017-12-05 DIAGNOSIS — Z79899 Other long term (current) drug therapy: Secondary | ICD-10-CM | POA: Insufficient documentation

## 2017-12-05 DIAGNOSIS — E119 Type 2 diabetes mellitus without complications: Secondary | ICD-10-CM | POA: Insufficient documentation

## 2017-12-05 DIAGNOSIS — I10 Essential (primary) hypertension: Secondary | ICD-10-CM | POA: Insufficient documentation

## 2017-12-05 DIAGNOSIS — Z7901 Long term (current) use of anticoagulants: Secondary | ICD-10-CM | POA: Insufficient documentation

## 2017-12-05 LAB — CBG MONITORING, ED: Glucose-Capillary: 269 mg/dL — ABNORMAL HIGH (ref 70–99)

## 2017-12-05 LAB — CBC WITH DIFFERENTIAL/PLATELET
ABS IMMATURE GRANULOCYTES: 0 10*3/uL (ref 0.0–0.1)
BASOS ABS: 0 10*3/uL (ref 0.0–0.1)
BASOS PCT: 0 %
Eosinophils Absolute: 0.2 10*3/uL (ref 0.0–0.7)
Eosinophils Relative: 2 %
HCT: 46.3 % — ABNORMAL HIGH (ref 36.0–46.0)
HEMOGLOBIN: 13.9 g/dL (ref 12.0–15.0)
IMMATURE GRANULOCYTES: 0 %
LYMPHS PCT: 23 %
Lymphs Abs: 2.2 10*3/uL (ref 0.7–4.0)
MCH: 21.1 pg — AB (ref 26.0–34.0)
MCHC: 30 g/dL (ref 30.0–36.0)
MCV: 70.3 fL — ABNORMAL LOW (ref 78.0–100.0)
Monocytes Absolute: 0.9 10*3/uL (ref 0.1–1.0)
Monocytes Relative: 9 %
NEUTROS ABS: 6.3 10*3/uL (ref 1.7–7.7)
NEUTROS PCT: 66 %
Platelets: 343 10*3/uL (ref 150–400)
RBC: 6.59 MIL/uL — AB (ref 3.87–5.11)
RDW: 15.9 % — ABNORMAL HIGH (ref 11.5–15.5)
WBC: 9.7 10*3/uL (ref 4.0–10.5)

## 2017-12-05 LAB — I-STAT TROPONIN, ED: TROPONIN I, POC: 0.02 ng/mL (ref 0.00–0.08)

## 2017-12-05 NOTE — ED Triage Notes (Signed)
Pt arrived via [facility] from her workplace after experiencing a near syncopal episode. Per ems, pt stated she became dizzy but denies CP or SOB. Pt has cardiac hx and has a atrial pacer which is set at 80, per pt. Pt endorses having palpitations and states heart rate was 140 prior to EMS arrival. EMS v/s 150/100, 98%ra, cbg 300 (+DM2) but ate shortly before EMS arrival. EMS gave 4mg  zofran PTA for nausea. Pt denies any falls.

## 2017-12-05 NOTE — ED Notes (Signed)
Patient transported to X-ray 

## 2017-12-05 NOTE — ED Notes (Signed)
ED Provider at bedside. 

## 2017-12-05 NOTE — ED Provider Notes (Signed)
[facility] EMERGENCY DEPARTMENT Provider Note   CSN: [id number] Arrival date & time: 12/05/17  2146     History   Chief Complaint Chief Complaint  Patient presents with  . Near Syncope    HPI Savannah Nielsen is a 52 y.o. female.  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  Patient states that she was at work this evening and she felt like her heart was racing.  She reports associated shortness of breath but denies chest pain.  Patient states that she became lightheaded and had tunnel vision.  She proceeded to have a syncopal episode that was witnessed by coworkers.  States it lasted several seconds and then she came to.  No seizure-like activity.  Patient did not fall or hit her head.  She was placed in a wheelchair.  Patient states that her heart rate was 140 prior to EMS arrival.  He states that she has been feeling okay up until this episode this evening.  Patient reports nausea but denies any emesis.  She also reports associated diaphoresis.  Patient symptoms are completely resolved at this time.  She denies any current complaints.  Patient denies any recent illnesses.  Denies any lower extremity swelling or calf tenderness, history of DVT/PE, prolonged immobilization, recent hospitalizations or surgeries.  Patient denies any hormone therapy, hemoptysis or cancer diagnosis.  Make symptoms better or worse.  She did not take anything for her symptoms prior to arrival.  Pt denies any fever, chill, ha, vision changes,  congestion, neck pain, cp,cough, abd pain, n/v/d, urinary symptoms, change in bowel habits, melena, hematochezia, lower extremity paresthesias.  Past Medical History:  Diagnosis Date  . Diabetes mellitus without  complication (HCC)   . GERD (gastroesophageal reflux disease)   . Hypertension     Patient Active Problem List   Diagnosis Date Noted  . Morbid obesity (HCC) 08/10/2017  . Diabetes mellitus with coincident hypertension (HCC)   . Diabetes mellitus without complication [facility])     Past Surgical History:  Procedure Laterality Date  . PACEMAKER IMPLANT     Biotronik  . TUBAL LIGATION       OB History   None      Home Medications    Prior to Admission medications   Medication Sig Start Date End Date Taking? Authorizing Provider  Ascorbic Acid (VITAMIN C PO) Take 1 tablet by mouth daily.    [provider]  aspirin EC 81 MG tablet Take 81 mg by mouth daily.    [provider]  Cholecalciferol (VITAMIN D-3 PO) Take 1 capsule by mouth daily.    [provider]  Cyanocobalamin (VITAMIN B-12 PO) Take 1 tablet by mouth daily.    [provider]  furosemide (LASIX) 20 MG tablet Take 1 tablet (20 mg total) by mouth daily. 08/10/17 11/08/17  Jake Bathe, MD  LANTUS 100 UNIT/ML injection Inject 0.4 mLs (40 Units total) into the skin at bedtime. 06/15/17   Doristine Bosworth, MD  losartan (COZAAR) 100 MG tablet Take 1 tablet (100 mg total) by mouth daily. 08/10/17 11/08/17  Jake Bathe, MD  metFORMIN (GLUCOPHAGE-XR) 500 MG 24 hr tablet Take 1000mg  (2 tabs) in the morning and one 500mg  in the evening 06/15/17   Collie Siad A, MD  metoprolol tartrate (LOPRESSOR) 25 MG tablet  Take 1 tablet (25 mg total) by mouth 2 (two) times daily. Or as needed 06/01/17   Jake BatheSkains, Mark C, MD  Multiple Vitamins-Calcium (ONE-A-DAY WOMENS FORMULA PO) Take 1 tablet by mouth daily.    [provider]  naproxen sodium (ALEVE) 220 MG tablet Take 440 mg by mouth daily with breakfast.    [provider]  Omega-3 Fatty Acids (OMEGA-3 FISH OIL CONCENTRATE PO) Take 1 capsule by mouth daily.    [provider]  ranitidine (ZANTAC) 150 MG tablet Take 150 mg by mouth  2 (two) times daily.    [provider]    Family History Family History  Problem Relation Age of Onset  . Diabetes Mother   . Hyperlipidemia Mother   . Hypertension Mother   . Diabetes Father   . Hyperlipidemia Father   . Stroke Father   . Heart disease Maternal Grandmother   . Hypertension Maternal Grandmother   . Heart disease Maternal Grandfather   . Heart disease Paternal Grandmother   . Heart disease Paternal Grandfather     Social History Social History   Tobacco Use  . Smoking status: Former Smoker    Last attempt to quit: 05/30/1994    Years since quitting: 23.5  . Smokeless tobacco: Never Used  Substance Use Topics  . Alcohol use: No    Frequency: Never  . Drug use: No     Allergies   Betadine [povidone iodine]   Review of Systems Review of Systems  All other systems reviewed and are negative.    Physical Exam Updated Vital Signs BP (!) 121/93   Pulse 85   Temp 98.1 F (36.7 C) (Oral)   Resp 16   SpO2 96%   Physical Exam  Constitutional: She is oriented to person, place, and time. She appears well-developed and well-nourished.  Non-toxic appearance. No distress.  HENT:  Head: Normocephalic and atraumatic.  Nose: Nose normal.  Mouth/Throat: Oropharynx is clear and moist.  Eyes: Pupils are equal, round, and reactive to light. Conjunctivae are normal. Right eye exhibits no discharge. Left eye exhibits no discharge.  Neck: Normal range of motion. Neck supple. No JVD present. No tracheal deviation present.  Cardiovascular: Normal rate, regular rhythm, normal heart sounds and intact distal pulses.  Pulmonary/Chest: Effort normal and breath sounds normal. No respiratory distress. She exhibits no tenderness.  No hypoxia or tachypnea.  Abdominal: Soft. Bowel sounds are normal. She exhibits no distension. There is no tenderness. There is no rebound and no guarding.  Musculoskeletal: Normal range of motion.  No lower extremity edema or calf  tenderness.  Lymphadenopathy:    She has no cervical adenopathy.  Neurological: She is alert and oriented to person, place, and time.  The patient is alert, attentive, and oriented x 3. Speech is clear. Cranial nerve II-VII grossly intact. Negative pronator drift. Sensation intact. Strength 5/5 in all extremities. Reflexes 2+ and symmetric at biceps, triceps, knees, and ankles. Rapid alternating movement and fine finger movements intact.    Skin: Skin is warm and dry. Capillary refill takes less than 2 seconds. She is not diaphoretic.  Psychiatric: Her behavior is normal. Judgment and thought content normal.  Nursing note and vitals reviewed.    ED Treatments / Results  Labs (all labs ordered are listed, but only abnormal results are displayed) Labs Reviewed  CBG MONITORING, ED - Abnormal; Notable for the following components:      Result Value   Glucose-Capillary 269 (*)    All  other components within normal limits    EKG EKG Interpretation  Date/Time:  Tuesday December 05 2017 21:56:48 EDT Ventricular Rate:  84 PR Interval:    QRS Duration: 136 QT Interval:  390 QTC Calculation: 461 R Axis:   57 Text Interpretation:  Sinus rhythm Consider left atrial enlargement Left bundle branch block Confirmed by Palumbo, April ([id number]) on 12/06/2017 1:15:00 AM   Radiology Dg Chest 2 View  Result Date: 12/05/2017 CLINICAL DATA:  Syncope EXAM: CHEST - 2 VIEW COMPARISON:  05/12/2017 FINDINGS: Normal heart size and mediastinal contours. Dual-chamber pacer leads from the left. Artifact from EKG leads. There is no edema, consolidation, effusion, or pneumothorax. IMPRESSION: No evidence of active disease. Electronically Signed   By: Marnee Spring M.D.   On: 12/05/2017 23:00    Procedures Procedures (including critical care time)  Medications Ordered in ED Medications - No data to display   Initial Impression / Assessment and Plan / ED Course  I have reviewed the triage vital signs and the  nursing notes.  Pertinent labs & imaging results that were available during my care of the patient were reviewed by me and considered in my medical decision making (see chart for details).     Patient presents to the ED for evaluation of palpitations and syncopal episode.  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  Patient is followed by cardiology.  Patient reports some shortness of breath after the episode.  On exam patient is overall well-appearing and nontoxic.  Vital signs are reassuring.  On exam lungs clear to auscultation bilaterally.  Heart regular rate and rhythm.  Neurovascular intact in all extremities.  No lower extremity edema or calf tenderness.  Patient has no focal neuro deficit on my exam.  Lab work reassuring.  No leukocytosis.  No significant electrolyte derangement.  Normal kidney function.  Negative troponin and normal d-dimer.  EKG shows normal sinus rhythm with left bundle branch block with history of same without any acute changes.  No widening of the QRS or peak T waves. chest x-ray reassuring.  Normal orthostatic vital signs.  I had pacemaker interrogated.  Does show 2 episodes of a tacky arrhythmia likely SVT at a rate of 150 at the time of patient's syncopal episode.  This is likely causing the symptoms that patient had.  I discussed with the cardiology service.  They will see patient in consultation.  After discussion with cardiology they offered patient admission however patient would like to go home and they felt that this was reasonable.  Recommended to increase patient's metoprolol from 25 mg twice daily to 50 mg twice daily.  They will see patient in the office this week.  Clinical presentation does not seem consistent with ACS, PE, dissection, CVA.  Feel comfortable with discharge at this time given reassuring vital signs and lab work.  Patient has been monitored several hours in the ED without any further arrhythmias.   Pt is hemodynamically  stable, in NAD, & able to ambulate in the ED. Evaluation does not show pathology that would require ongoing emergent intervention or inpatient treatment. I explained the diagnosis to the patient. Pain has been managed & has no complaints prior to dc. Pt is comfortable with above plan and is stable for discharge at this time. All questions were answered prior to disposition. Strict return precautions for f/u to the ED were discussed. Encouraged follow up with PCP.  Final Clinical Impressions(s) / ED Diagnoses   Final diagnoses:  Syncope and  collapse    ED Discharge Orders    None       Wallace Keller 12/06/17 1610    Little, Ambrose Finland, MD 12/06/17 1248

## 2017-12-06 LAB — BASIC METABOLIC PANEL
Anion gap: 11 (ref 5–15)
BUN: 12 mg/dL (ref 6–20)
CALCIUM: 9.2 mg/dL (ref 8.9–10.3)
CO2: 27 mmol/L (ref 22–32)
CREATININE: 1.07 mg/dL — AB (ref 0.44–1.00)
Chloride: 100 mmol/L (ref 98–111)
GFR, EST NON AFRICAN AMERICAN: 58 mL/min — AB (ref >60)
Glucose, Bld: 252 mg/dL — ABNORMAL HIGH (ref 70–99)
Potassium: 3.5 mmol/L (ref 3.5–5.1)
SODIUM: 138 mmol/L (ref 135–145)

## 2017-12-06 LAB — D-DIMER, QUANTITATIVE: D-Dimer, Quant: 0.28 ug/mL-FEU (ref 0.00–0.50)

## 2017-12-06 LAB — MAGNESIUM: MAGNESIUM: 1.6 mg/dL — AB (ref 1.7–2.4)

## 2017-12-06 NOTE — ED Notes (Signed)
Patient verbalizes understanding of discharge instructions. Opportunity for questioning and answers were provided. Armband removed by staff, pt discharged from ED ambulatory.   

## 2017-12-06 NOTE — ED Notes (Signed)
Spoke to a representative from [employer]. They stated someone will be paged out to interrogate pacemaker tonight

## 2017-12-06 NOTE — Consult Note (Addendum)
Reason for Consult: tachycardia   Requesting Physician/Service: ED PA [place]   PCP:  Forrest Moron, MD Primary Cardiologist: Marlou Porch  HPI:  This is a 52 year old woman with history of Biotronik dual-chamber pacmaker placed in [place] for tachy-brady syndrome, HTN, DM presenting with syncope.  She is a travel Marine scientist and at work noticed strong palpitations while she was walking in the hall.  She took her pulse which was upper 140s.  After this she sat down in a chair and had a syncopal episode which was brief at ~8 PM.. No chest pain around the event.  Afterwards she felt some shortness of breath for several minutes and decided to come to the emergency room.  By arrival, she returned to normal state of health.  In the emergency room, labs reveal creatinine 1.07, potassium 3.5, magnesium added on, negative D dimer and negative troponin at 11:30P.  Biotronik interrogated device which showed normal pacemaker function, lead function, impedances.  Captured were 2 episodes of tachycardia ~ 145 BPM.    Previous Cardiac Studies: TTE 07/2017 - Left ventricle: The cavity size was normal. Systolic function was   vigorous. The estimated ejection fraction was in the range of 65%   to 70%. Wall motion was normal; there were no regional wall   motion abnormalities. Doppler parameters are consistent with   abnormal left ventricular relaxation (grade 1 diastolic   dysfunction). Doppler parameters are borderline significant for   elevated mean left atrial filling pressure. - Pulmonary arteries: Systolic pressure was mildly increased. PA   peak pressure: 35 mm Hg (S).  Past Medical History:  Diagnosis Date  . Diabetes mellitus without complication ([place])   . GERD (gastroesophageal reflux disease)   . Hypertension     Past Surgical History:  Procedure Laterality Date  . PACEMAKER IMPLANT     Biotronik  . TUBAL LIGATION      Family History  Problem Relation Age of Onset  . Diabetes Mother    . Hyperlipidemia Mother   . Hypertension Mother   . Diabetes Father   . Hyperlipidemia Father   . Stroke Father   . Heart disease Maternal Grandmother   . Hypertension Maternal Grandmother   . Heart disease Maternal Grandfather   . Heart disease Paternal Grandmother   . Heart disease Paternal Grandfather    Social History:  reports that she quit smoking about 23 years ago. She has never used smokeless tobacco. She reports that she does not drink alcohol or use drugs.  Allergies:  Allergies  Allergen Reactions  . Betadine [Povidone Iodine] Hives and Rash    No current facility-administered medications on file prior to encounter.    Current Outpatient Medications on File Prior to Encounter  Medication Sig Dispense Refill  . Ascorbic Acid (VITAMIN C PO) Take 1 tablet by mouth daily.    Marland Kitchen aspirin EC 81 MG tablet Take 81 mg by mouth daily.    . Cholecalciferol (VITAMIN D-3 PO) Take 1 capsule by mouth daily.    . Cyanocobalamin (VITAMIN B-12 PO) Take 1 tablet by mouth daily.    . furosemide (LASIX) 20 MG tablet Take 1 tablet (20 mg total) by mouth daily. 90 tablet 3  . LANTUS 100 UNIT/ML injection Inject 0.4 mLs (40 Units total) into the skin at bedtime. 10 mL 0  . losartan (COZAAR) 100 MG tablet Take 1 tablet (100 mg total) by mouth daily. 90 tablet 3  . metFORMIN (GLUCOPHAGE-XR) 500 MG 24 hr tablet Take  1079m (2 tabs) in the morning and one 5082min the evening 270 tablet 1  . metoprolol tartrate (LOPRESSOR) 25 MG tablet Take 1 tablet (25 mg total) by mouth 2 (two) times daily. Or as needed 200 tablet 3  . Multiple Vitamins-Calcium (ONE-A-DAY WOMENS FORMULA PO) Take 1 tablet by mouth daily.    . naproxen sodium (ALEVE) 220 MG tablet Take 440 mg by mouth daily with breakfast.    . Omega-3 Fatty Acids (OMEGA-3 FISH OIL CONCENTRATE PO) Take 1 capsule by mouth daily.    . ranitidine (ZANTAC) 150 MG tablet Take 150 mg by mouth 2 (two) times daily.       '@medshecduled' @ '@medinfusions' @  Results for orders placed or performed during the hospital encounter of 12/05/17 (from the past 48 hour(s))  CBG monitoring, ED     Status: Abnormal   Collection Time: 12/05/17  9:48 PM  Result Value Ref Range   Glucose-Capillary 269 (H) 70 - 99 mg/dL   Comment 1 Notify RN   Basic metabolic panel     Status: Abnormal   Collection Time: 12/05/17 11:26 PM  Result Value Ref Range   Sodium 138 135 - 145 mmol/L   Potassium 3.5 3.5 - 5.1 mmol/L   Chloride 100 98 - 111 mmol/L    Comment: Please note change in reference range.   CO2 27 22 - 32 mmol/L   Glucose, Bld 252 (H) 70 - 99 mg/dL    Comment: Please note change in reference range.   BUN 12 6 - 20 mg/dL    Comment: Please note change in reference range.   Creatinine, Ser 1.07 (H) 0.44 - 1.00 mg/dL   Calcium 9.2 8.9 - 10.3 mg/dL   GFR calc non Af Amer 58 (L) >60 mL/min   GFR calc Af Amer >60 >60 mL/min    Comment: (NOTE) The eGFR has been calculated using the CKD EPI equation. This calculation has not been validated in all clinical situations. eGFR's persistently <60 mL/min signify possible Chronic Kidney Disease.    Anion gap 11 5 - 15    Comment: Performed at [facility] [place] [id number] WITH DIFFERENTIAL     Status: Abnormal   Collection Time: 12/05/17 11:26 PM  Result Value Ref Range   WBC 9.7 4.0 - 10.5 K/uL   RBC 6.59 (H) 3.87 - 5.11 MIL/uL   Hemoglobin 13.9 12.0 - 15.0 g/dL   HCT 46.3 (H) 36.0 - 46.0 %   MCV 70.3 (L) 78.0 - 100.0 fL   MCH 21.1 (L) 26.0 - 34.0 pg   MCHC 30.0 30.0 - 36.0 g/dL   RDW 15.9 (H) 11.5 - 15.5 %   Platelets 343 150 - 400 K/uL   Neutrophils Relative % 66 %   Neutro Abs 6.3 1.7 - 7.7 K/uL   Lymphocytes Relative 23 %   Lymphs Abs 2.2 0.7 - 4.0 K/uL   Monocytes Relative 9 %   Monocytes Absolute 0.9 0.1 - 1.0 K/uL   Eosinophils Relative 2 %   Eosinophils Absolute 0.2 0.0 - 0.7 K/uL   Basophils Relative 0 %    Basophils Absolute 0.0 0.0 - 0.1 K/uL   Immature Granulocytes 0 %   Abs Immature Granulocytes 0.0 0.0 - 0.1 K/uL    Comment: Performed at [facility] [place] [address]D-dimer, quantitative (not at [facility]    Status: None   Collection Time: 12/05/17  11:26 PM  Result Value Ref Range   D-Dimer, Quant 0.28 0.00 - 0.50 ug/mL-FEU    Comment: (NOTE) At the manufacturer cut-off of 0.50 ug/mL FEU, this assay has been documented to exclude PE with a sensitivity and negative predictive value of 97 to 99%.  At this time, this assay has not been approved by the FDA to exclude DVT/VTE. Results should be correlated with clinical presentation. Performed at [facility], [place] [address], [place], [place] [address]   I-stat troponin, ED     Status: None   Collection Time: 12/05/17 11:35 PM  Result Value Ref Range   Troponin i, poc 0.02 0.00 - 0.08 ng/mL   Comment 3            Comment: Due to the release kinetics of cTnI, a negative result within the first hours of the onset of symptoms does not rule out myocardial infarction with certainty. If myocardial infarction is still suspected, repeat the test at appropriate intervals.    Dg Chest 2 View  Result Date: 12/05/2017 CLINICAL DATA:  Syncope EXAM: CHEST - 2 VIEW COMPARISON:  05/12/2017 FINDINGS: Normal heart size and mediastinal contours. Dual-chamber pacer leads from the left. Artifact from EKG leads. There is no edema, consolidation, effusion, or pneumothorax. IMPRESSION: No evidence of active disease. Electronically Signed   By: Monte Fantasia M.D.   On: 12/05/2017 23:00    ECG/TELE: Sinus with LBBB Rate 80s.  LBBB not new (possibly V paced)  ROS: As above. Otherwise, review of systems is negative unless per above HPI  Vitals:   12/06/17 0115 12/06/17 0130 12/06/17 0145 12/06/17 0200  BP:  111/63    Pulse: 65 (!) 45 61 61  Resp: '15 11 18 12  ' Temp:      TempSrc:      SpO2: 99% 96% 97% 97%   Wt  Readings from Last 10 Encounters:  08/15/17 95.3 kg (210 lb)  08/10/17 94.9 kg (209 lb 3.2 oz)  08/05/17 98.5 kg (217 lb 3.2 oz)  07/03/17 95.3 kg (210 lb)  06/15/17 95.7 kg (211 lb)  06/13/17 95.7 kg (211 lb)  06/01/17 95.3 kg (210 lb)  05/12/17 95.3 kg (210 lb)    PE:  General: No acute distress HEENT: Atraumatic, EOMI, mucous membranes moist. No JVD at 45 degrees. No HJR. CV: RRR no murmurs, gallops.  Respiratory: Clear, no crackles. Normal work of breathing ABD: Non-distended and non-tender. No palpable organomegaly.  Extremities: 2+ radial pulses bilaterally. no edema. Neuro/Psych: CN grossly intact, alert and oriented  Assessment/Plan Syncopal event  Tachycardia event on Biotronik (140s BPM) around the time  Reviewed interrogation report.  Device is functioning normally with normal lead/generator findings.  Monitored tachycardia spells rhythm appears likely SVT (1:1 A:V ratio) at close to 150 BPM.  Differential Atach/AVNRT.  A activation just prior/at V activation.  She has been monitored on telemetry for many hours without recurrence and is asymptomatic.  Dimer, troponin negative.  I have offered her admission for further monitoring, but she would prefer outpatient follow up.  This is not unreasonable.  Current metoprolol dose is 25 mg BID, I recommend increasing this to 50 mg BID. Recent echocardiogram benign.  I will ask my cardiology colleagues to set up an appointment for her, at earliest ability. She has been consulled on strict return precautions.      Lolita Cram Ayham Word  MD 12/06/2017, 2:26 AM

## 2017-12-06 NOTE — Discharge Instructions (Addendum)
Please make she follow-up with cardiology.  If symptoms persist or return return to the ED immediately.

## 2017-12-14 ENCOUNTER — Ambulatory Visit (INDEPENDENT_AMBULATORY_CARE_PROVIDER_SITE_OTHER): Payer: Self-pay | Admitting: *Deleted

## 2017-12-14 DIAGNOSIS — I495 Sick sinus syndrome: Secondary | ICD-10-CM

## 2017-12-14 NOTE — Progress Notes (Signed)
Remote pacemaker transmission.   

## 2017-12-15 ENCOUNTER — Encounter: Payer: Self-pay | Admitting: Cardiology

## 2017-12-15 LAB — CUP PACEART REMOTE DEVICE CHECK
Battery Remaining Percentage: 90 %
Brady Statistic AP VS Percent: 0 %
Brady Statistic AS VS Percent: 0 %
Brady Statistic RA Percent Paced: 98 %
Date Time Interrogation Session: 20190719143413
Lead Channel Impedance Value: 564 Ohm
Lead Channel Pacing Threshold Pulse Width: 0.4 ms
Lead Channel Setting Pacing Pulse Width: 0.4 ms
MDC IDC MSMT LEADCHNL RA IMPEDANCE VALUE: 505 Ohm
MDC IDC MSMT LEADCHNL RA PACING THRESHOLD AMPLITUDE: 0.6 V
MDC IDC MSMT LEADCHNL RA PACING THRESHOLD PULSEWIDTH: 0.4 ms
MDC IDC MSMT LEADCHNL RV PACING THRESHOLD AMPLITUDE: 0.7 V
MDC IDC PG IMPLANT DT: 20180411
MDC IDC SET LEADCHNL RA PACING AMPLITUDE: 1.6 V
MDC IDC SET LEADCHNL RV PACING AMPLITUDE: 1.3 V
MDC IDC STAT BRADY AP VP PERCENT: 98 %
MDC IDC STAT BRADY AS VP PERCENT: 2 %
MDC IDC STAT BRADY RV PERCENT PACED: 100 %
Pulse Gen Serial Number: 69007337

## 2017-12-18 ENCOUNTER — Encounter: Payer: Self-pay | Admitting: Internal Medicine

## 2018-02-22 IMAGING — DX DG CHEST 2V
2 series · 2 of 2 positions shown · non-contrast
Comparison: None.

CLINICAL DATA: Tachycardia

EXAM:
CHEST  2 VIEW

[chest pa]
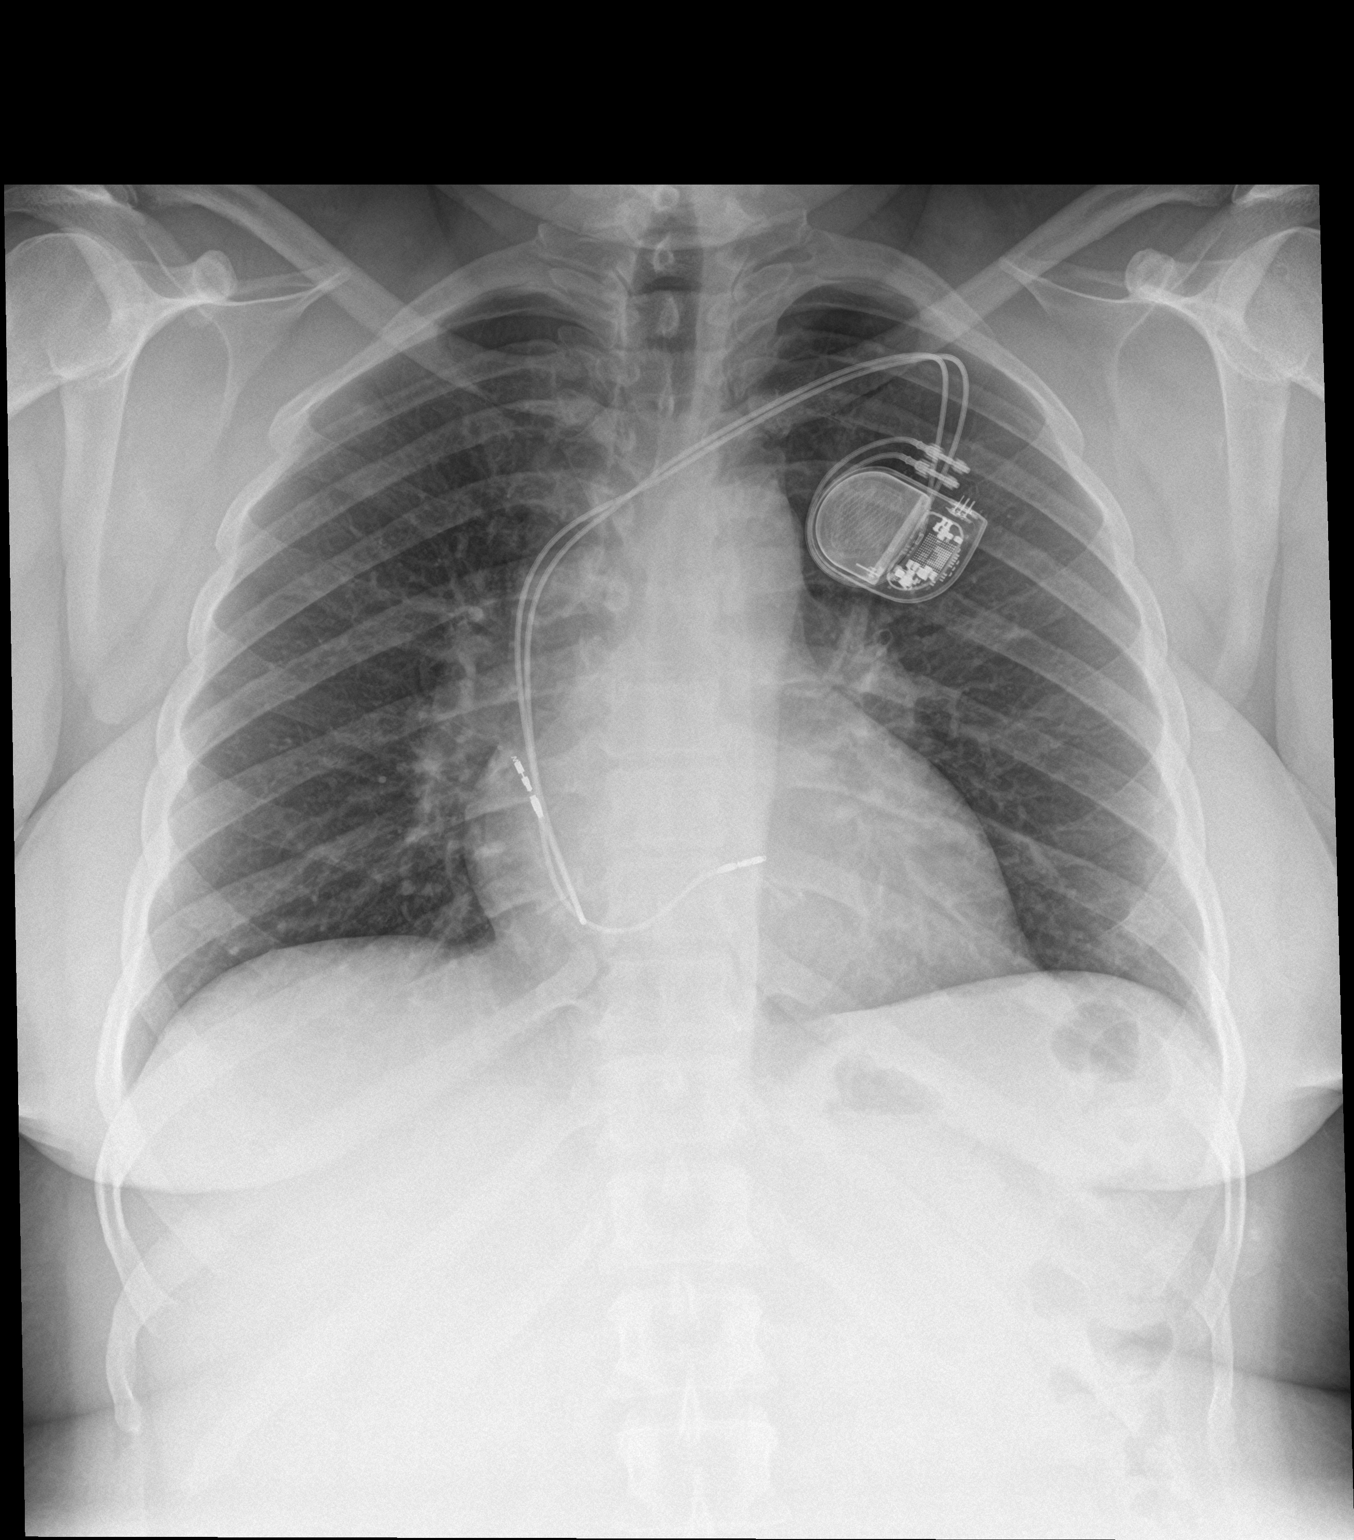

[chest lat]
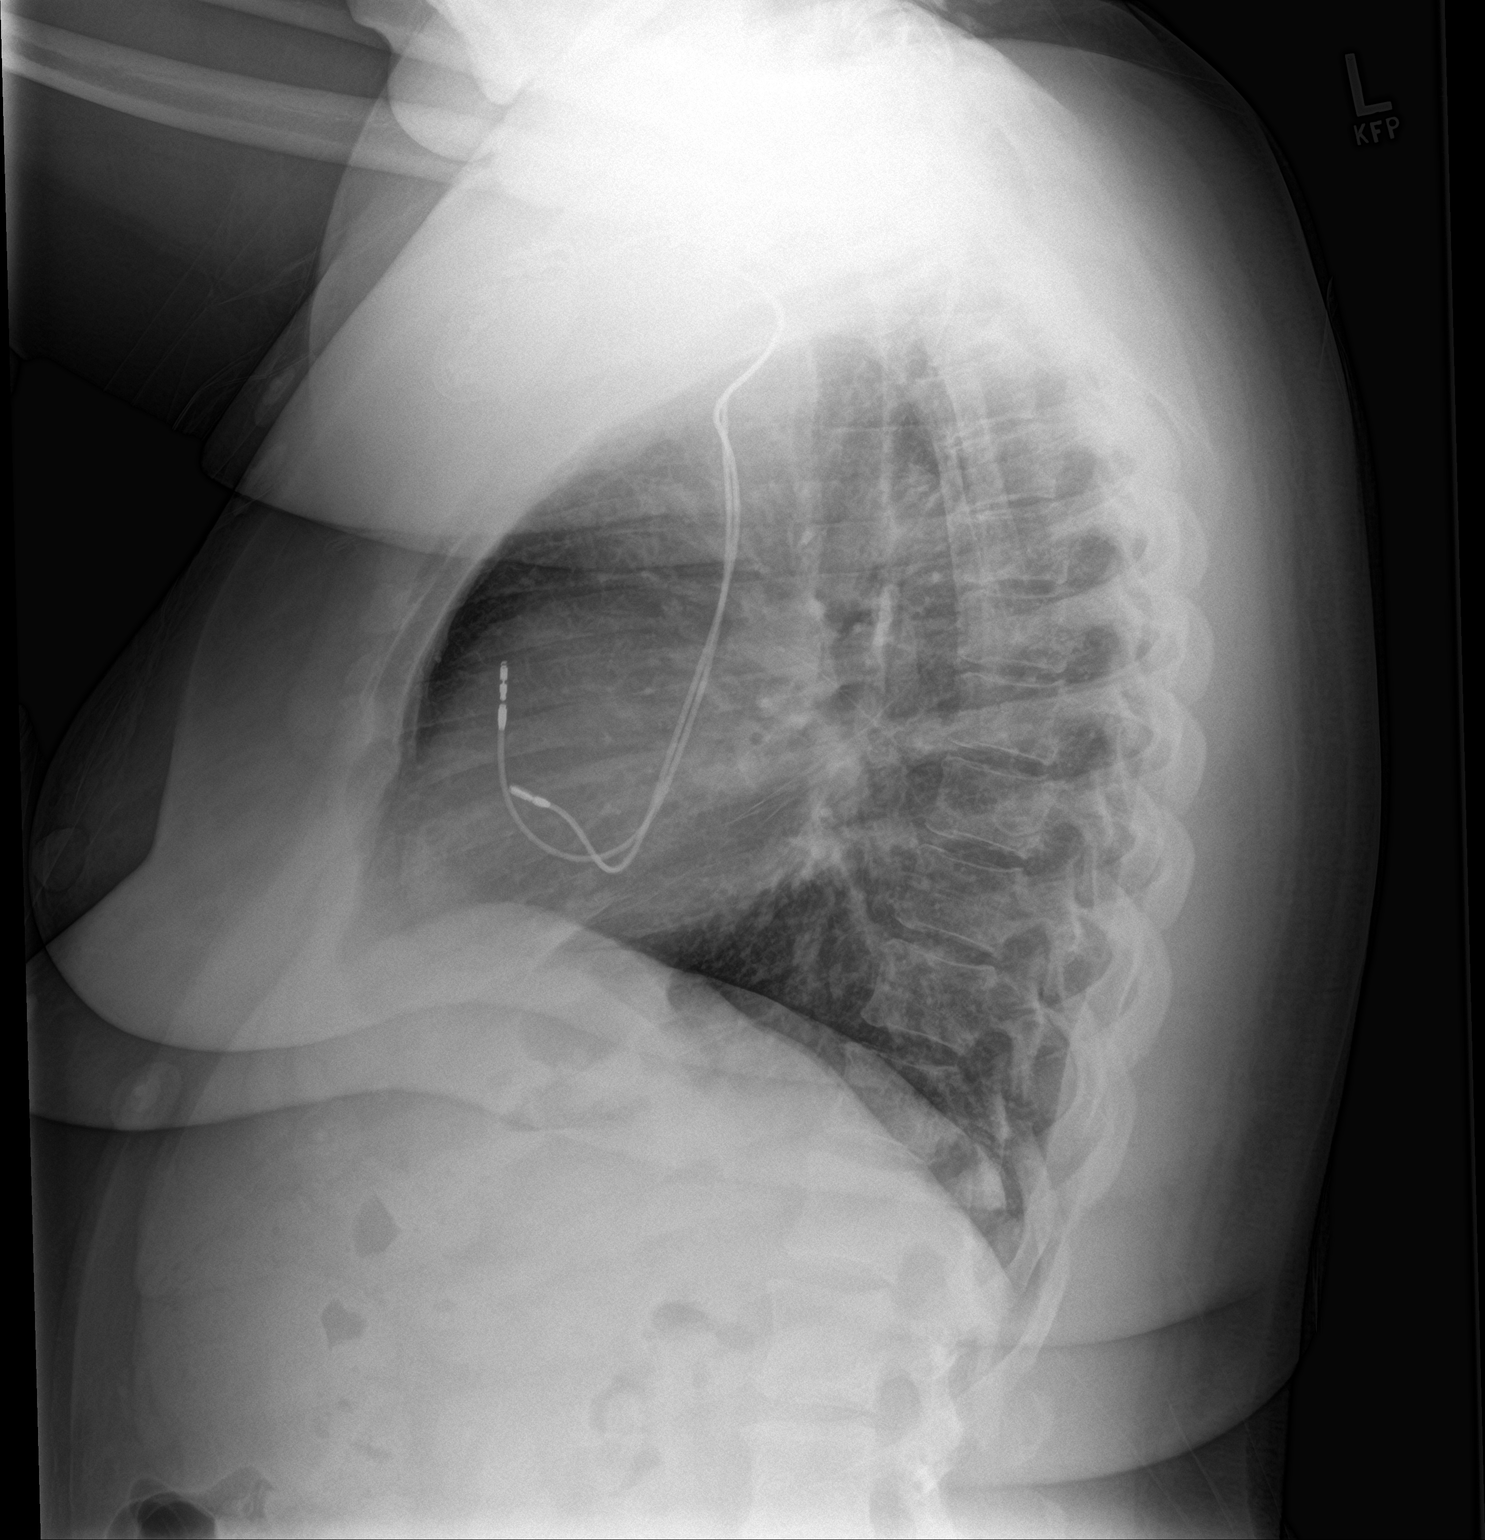

[2 of 2 positions shown; findings below may reference images not displayed]

FINDINGS: There is a pacemaker with leads attached to the right heart. Heart
is upper normal in size with pulmonary vascularity within normal
limits. No adenopathy. Lungs are clear. No bone lesions.
IMPRESSION: Heart upper normal in size with pacemaker leads attached to right
heart. No edema or consolidation.

## 2018-03-09 ENCOUNTER — Encounter: Payer: Self-pay | Admitting: Internal Medicine

## 2018-03-09 DIAGNOSIS — R0989 Other specified symptoms and signs involving the circulatory and respiratory systems: Secondary | ICD-10-CM

## 2018-03-12 ENCOUNTER — Encounter: Payer: Self-pay | Admitting: Internal Medicine

## 2018-03-15 ENCOUNTER — Ambulatory Visit (INDEPENDENT_AMBULATORY_CARE_PROVIDER_SITE_OTHER): Payer: Self-pay | Admitting: *Deleted

## 2018-03-15 DIAGNOSIS — I10 Essential (primary) hypertension: Secondary | ICD-10-CM

## 2018-03-15 DIAGNOSIS — I495 Sick sinus syndrome: Secondary | ICD-10-CM

## 2018-03-15 NOTE — Progress Notes (Signed)
Remote pacemaker transmission.   

## 2018-05-19 LAB — CUP PACEART REMOTE DEVICE CHECK
Date Time Interrogation Session: 20191221204615
Implantable Lead Implant Date: 20180411
Implantable Lead Location: 753860
Implantable Lead Model: 377
Implantable Lead Model: 377
Implantable Lead Serial Number: 49852560
MDC IDC LEAD IMPLANT DT: 20180411
MDC IDC LEAD LOCATION: 753859
MDC IDC LEAD SERIAL: 49852585
MDC IDC PG IMPLANT DT: 20180411
Pulse Gen Serial Number: 69007337

## 2018-06-14 ENCOUNTER — Ambulatory Visit (INDEPENDENT_AMBULATORY_CARE_PROVIDER_SITE_OTHER): Payer: Self-pay

## 2018-06-14 DIAGNOSIS — I495 Sick sinus syndrome: Secondary | ICD-10-CM

## 2018-06-14 DIAGNOSIS — I10 Essential (primary) hypertension: Secondary | ICD-10-CM

## 2018-06-15 NOTE — Progress Notes (Signed)
Remote pacemaker transmission.   

## 2018-06-16 LAB — CUP PACEART REMOTE DEVICE CHECK
Date Time Interrogation Session: 20200118172040
Implantable Lead Implant Date: 20180411
Implantable Lead Location: 753860
Implantable Lead Serial Number: 49852585
MDC IDC LEAD IMPLANT DT: 20180411
MDC IDC LEAD LOCATION: 753859
MDC IDC LEAD SERIAL: 49852560
MDC IDC PG IMPLANT DT: 20180411
Pulse Gen Model: 407145
Pulse Gen Serial Number: 69007337

## 2018-09-13 ENCOUNTER — Other Ambulatory Visit: Payer: Self-pay

## 2018-09-13 ENCOUNTER — Ambulatory Visit (INDEPENDENT_AMBULATORY_CARE_PROVIDER_SITE_OTHER): Payer: Self-pay | Admitting: *Deleted

## 2018-09-13 DIAGNOSIS — I495 Sick sinus syndrome: Secondary | ICD-10-CM

## 2018-09-14 LAB — CUP PACEART REMOTE DEVICE CHECK
Date Time Interrogation Session: 20200417105749
Implantable Lead Implant Date: 20180411
Implantable Lead Implant Date: 20180411
Implantable Lead Location: 753859
Implantable Lead Location: 753860
Implantable Lead Model: 377
Implantable Lead Model: 377
Implantable Lead Serial Number: 49852560
Implantable Lead Serial Number: 49852585
Implantable Pulse Generator Implant Date: 20180411
Pulse Gen Model: 407145
Pulse Gen Serial Number: 69007337

## 2018-09-19 ENCOUNTER — Encounter: Payer: Self-pay | Admitting: Cardiology

## 2018-09-19 NOTE — Progress Notes (Signed)
Remote pacemaker transmission.   

## 2018-10-29 ENCOUNTER — Telehealth: Payer: Self-pay | Admitting: *Deleted

## 2018-10-29 NOTE — Telephone Encounter (Signed)
Received Home Monitoring alert for "AT" episode on 10/26/18. EGM shows A-flutter, duration 28 sec. Pt is overdue for f/u with Dr. Ladona Ridgel as of 05/2018 per recall. Will make scheduler aware.

## 2018-11-13 ENCOUNTER — Telehealth: Payer: Medicaid - Out of State | Admitting: Internal Medicine

## 2018-11-13 ENCOUNTER — Other Ambulatory Visit: Payer: Self-pay

## 2018-12-17 ENCOUNTER — Ambulatory Visit (INDEPENDENT_AMBULATORY_CARE_PROVIDER_SITE_OTHER): Payer: Medicaid - Out of State | Admitting: *Deleted

## 2018-12-17 DIAGNOSIS — I495 Sick sinus syndrome: Secondary | ICD-10-CM | POA: Diagnosis not present

## 2018-12-19 LAB — CUP PACEART REMOTE DEVICE CHECK
Date Time Interrogation Session: 20200722115310
Implantable Lead Implant Date: 20180411
Implantable Lead Implant Date: 20180411
Implantable Lead Location: 753859
Implantable Lead Location: 753860
Implantable Lead Model: 377
Implantable Lead Model: 377
Implantable Lead Serial Number: 49852560
Implantable Lead Serial Number: 49852585
Implantable Pulse Generator Implant Date: 20180411
Pulse Gen Model: 407145
Pulse Gen Serial Number: 69007337

## 2018-12-31 NOTE — Progress Notes (Signed)
Remote pacemaker transmission.   

## 2019-01-10 ENCOUNTER — Telehealth: Payer: Self-pay | Admitting: Emergency Medicine

## 2019-01-10 NOTE — Telephone Encounter (Signed)
LMOM for patient to call DC. Biotron[employer] alert for no contact with remote monitor for 21 days.

## 2019-01-11 NOTE — Telephone Encounter (Signed)
LMOVM for pt to return call in regards to Biotronik disconnected monitor.

## 2019-01-15 NOTE — Telephone Encounter (Signed)
LMOVM for pt to return call 

## 2019-01-16 ENCOUNTER — Encounter: Payer: Self-pay | Admitting: Cardiology

## 2019-01-16 NOTE — Telephone Encounter (Signed)
3rd attempt   LMOVM for pt to return call. Letter mailed.

## 2019-01-30 ENCOUNTER — Encounter: Payer: Self-pay | Admitting: Cardiology

## 2019-01-30 NOTE — Telephone Encounter (Signed)
Certified letter  

## 2019-03-18 ENCOUNTER — Ambulatory Visit (INDEPENDENT_AMBULATORY_CARE_PROVIDER_SITE_OTHER): Payer: Medicaid - Out of State | Admitting: *Deleted

## 2019-03-18 DIAGNOSIS — I495 Sick sinus syndrome: Secondary | ICD-10-CM

## 2019-03-18 LAB — CUP PACEART REMOTE DEVICE CHECK
Date Time Interrogation Session: 20201019210958
Implantable Lead Implant Date: 20180411
Implantable Lead Implant Date: 20180411
Implantable Lead Location: 753859
Implantable Lead Location: 753860
Implantable Lead Model: 377
Implantable Lead Model: 377
Implantable Lead Serial Number: 49852560
Implantable Lead Serial Number: 49852585
Implantable Pulse Generator Implant Date: 20180411
Pulse Gen Model: 407145
Pulse Gen Serial Number: 69007337

## 2019-03-25 ENCOUNTER — Telehealth: Payer: Self-pay

## 2019-03-25 NOTE — Telephone Encounter (Signed)
Attempted to reach pt for in office visit. No answer

## 2019-04-05 NOTE — Progress Notes (Signed)
Remote pacemaker transmission.   

## 2019-06-17 ENCOUNTER — Ambulatory Visit (INDEPENDENT_AMBULATORY_CARE_PROVIDER_SITE_OTHER): Payer: Medicaid - Out of State | Admitting: *Deleted

## 2019-06-17 DIAGNOSIS — I495 Sick sinus syndrome: Secondary | ICD-10-CM

## 2019-06-18 LAB — CUP PACEART REMOTE DEVICE CHECK
Date Time Interrogation Session: 20210119063827
Implantable Lead Implant Date: 20180411
Implantable Lead Implant Date: 20180411
Implantable Lead Location: 753859
Implantable Lead Location: 753860
Implantable Lead Model: 377
Implantable Lead Model: 377
Implantable Lead Serial Number: 49852560
Implantable Lead Serial Number: 49852585
Implantable Pulse Generator Implant Date: 20180411
Pulse Gen Model: 407145
Pulse Gen Serial Number: 69007337

## 2019-06-25 ENCOUNTER — Telehealth: Payer: Self-pay | Admitting: Emergency Medicine

## 2019-06-25 NOTE — Telephone Encounter (Signed)
LMOM to call office. Alert for no contact > 21 days with Biotronik monitor.

## 2019-06-27 NOTE — Telephone Encounter (Signed)
Letter sent.

## 2019-06-27 NOTE — Telephone Encounter (Signed)
Attempted to reach patient again, no answer. Left message on machine.  Kem, please send letter. She is also overdue for follow up with Dr Ladona Ridgel.  If we don't hear from her in 2 weeks, please send certified letter.  Gypsy Balsam, NP 06/27/2019 8:46 AM

## 2019-07-11 NOTE — Telephone Encounter (Signed)
Certified Letter sent

## 2019-07-26 ENCOUNTER — Telehealth: Payer: Self-pay

## 2019-07-26 NOTE — Telephone Encounter (Signed)
Biotronik alert received, pt has not transmitted in 21 days.  Attempted to reach pt to determine if issues with monitor.  No answer, DPR on file, ok to leave messag eon VM.  Left message requesting pt callback.

## 2019-07-29 NOTE — Telephone Encounter (Signed)
LMOVM (DPR) advising that home monitor is disconnected. Instructions and DC phone number provided.

## 2019-07-31 ENCOUNTER — Encounter: Payer: Self-pay | Admitting: *Deleted

## 2019-07-31 NOTE — Telephone Encounter (Signed)
Third attempt:  LMOVM (DPR) advising that home monitor is disconnected. Direct DC number provided for call back.  Pt also overdue for MD f/u. Mailed delinquent device check letter today. Will mail certified letter in 2 weeks if no response.

## 2019-09-16 ENCOUNTER — Ambulatory Visit (INDEPENDENT_AMBULATORY_CARE_PROVIDER_SITE_OTHER): Payer: Medicaid - Out of State | Admitting: *Deleted

## 2019-09-16 DIAGNOSIS — I495 Sick sinus syndrome: Secondary | ICD-10-CM

## 2019-09-16 LAB — CUP PACEART REMOTE DEVICE CHECK
Date Time Interrogation Session: 20210419110556
Implantable Lead Implant Date: 20180411
Implantable Lead Implant Date: 20180411
Implantable Lead Location: 753859
Implantable Lead Location: 753860
Implantable Lead Model: 377
Implantable Lead Model: 377
Implantable Lead Serial Number: 49852560
Implantable Lead Serial Number: 49852585
Implantable Pulse Generator Implant Date: 20180411
Pulse Gen Model: 407145
Pulse Gen Serial Number: 69007337

## 2019-09-16 NOTE — Progress Notes (Signed)
PPM Remote  

## 2019-09-17 NOTE — Telephone Encounter (Signed)
Home monitor up to date as of 09/08/19. Encounter closed.

## 2019-09-30 ENCOUNTER — Telehealth: Payer: Self-pay | Admitting: Emergency Medicine

## 2019-09-30 NOTE — Telephone Encounter (Signed)
LMOM that monitor disconnected  And that she will be scheduled to have follow up that is overdue with Dr Ladona Ridgel.

## 2019-10-02 NOTE — Telephone Encounter (Signed)
LMOM that monitor is disconnected and p[atient needs to be scheduled for follow up visit with Dr Ladona Ridgel.

## 2019-10-04 ENCOUNTER — Encounter: Payer: Self-pay | Admitting: Internal Medicine

## 2019-10-04 NOTE — Telephone Encounter (Signed)
LMOM for patient to call back.

## 2019-10-07 NOTE — Telephone Encounter (Signed)
Called patient regarding monitor disconnected and overdue apt with Dr. Ladona Ridgel. No answer. LMOVM.

## 2019-10-14 NOTE — Telephone Encounter (Signed)
LMOM per DPR that monitor is disconnected and for patient to call device clinic at number provided.

## 2019-10-16 NOTE — Telephone Encounter (Signed)
Home monitor up to date as of 10/15/19. Scheduler has also been attempting to reach patient to schedule overdue f/u and recently mailed letter. Encounter closed.

## 2019-11-06 ENCOUNTER — Encounter: Payer: Self-pay | Admitting: Cardiology

## 2019-12-16 ENCOUNTER — Ambulatory Visit (INDEPENDENT_AMBULATORY_CARE_PROVIDER_SITE_OTHER): Payer: Medicaid - Out of State | Admitting: *Deleted

## 2019-12-16 DIAGNOSIS — I495 Sick sinus syndrome: Secondary | ICD-10-CM | POA: Diagnosis not present

## 2019-12-16 LAB — CUP PACEART REMOTE DEVICE CHECK
Date Time Interrogation Session: 20210719102513
Implantable Lead Implant Date: 20180411
Implantable Lead Implant Date: 20180411
Implantable Lead Location: 753859
Implantable Lead Location: 753860
Implantable Lead Model: 377
Implantable Lead Model: 377
Implantable Lead Serial Number: 49852560
Implantable Lead Serial Number: 49852585
Implantable Pulse Generator Implant Date: 20180411
Pulse Gen Model: 407145
Pulse Gen Serial Number: 69007337

## 2019-12-17 NOTE — Progress Notes (Signed)
Remote pacemaker transmission.   

## 2019-12-18 ENCOUNTER — Telehealth: Payer: Self-pay

## 2019-12-18 NOTE — Telephone Encounter (Signed)
[employer] alert received, last remote communication 11/26/19.   Attempted to reach patient, no answer.  LVM requesting pt plug in monitor, DC phone # and hours provided for callback.

## 2019-12-23 NOTE — Telephone Encounter (Signed)
Device transmitting as of 12/22/19

## 2020-03-16 ENCOUNTER — Ambulatory Visit (INDEPENDENT_AMBULATORY_CARE_PROVIDER_SITE_OTHER): Payer: BC Managed Care – PPO

## 2020-03-16 DIAGNOSIS — I495 Sick sinus syndrome: Secondary | ICD-10-CM

## 2020-03-17 LAB — CUP PACEART REMOTE DEVICE CHECK
Date Time Interrogation Session: 20211019095405
Implantable Lead Implant Date: 20180411
Implantable Lead Implant Date: 20180411
Implantable Lead Location: 753859
Implantable Lead Location: 753860
Implantable Lead Model: 377
Implantable Lead Model: 377
Implantable Lead Serial Number: 49852560
Implantable Lead Serial Number: 49852585
Implantable Pulse Generator Implant Date: 20180411
Pulse Gen Model: 407145
Pulse Gen Serial Number: 69007337

## 2020-03-19 NOTE — Progress Notes (Signed)
Remote pacemaker transmission.   

## 2020-06-15 ENCOUNTER — Ambulatory Visit (INDEPENDENT_AMBULATORY_CARE_PROVIDER_SITE_OTHER): Payer: BC Managed Care – PPO

## 2020-06-15 DIAGNOSIS — I495 Sick sinus syndrome: Secondary | ICD-10-CM

## 2020-06-15 LAB — CUP PACEART REMOTE DEVICE CHECK
Date Time Interrogation Session: 20220114120403
Implantable Lead Implant Date: 20180411
Implantable Lead Implant Date: 20180411
Implantable Lead Location: 753859
Implantable Lead Location: 753860
Implantable Lead Model: 377
Implantable Lead Model: 377
Implantable Lead Serial Number: 49852560
Implantable Lead Serial Number: 49852585
Implantable Pulse Generator Implant Date: 20180411
Pulse Gen Model: 407145
Pulse Gen Serial Number: 69007337

## 2020-06-27 NOTE — Progress Notes (Signed)
Remote pacemaker transmission.   

## 2020-07-09 ENCOUNTER — Telehealth: Payer: Self-pay

## 2020-07-09 NOTE — Telephone Encounter (Signed)
[employer]ron[employer] alert received for 1 HVR event logged on 07/05/20 @ 1:57 pm with duration of 3 minutes 46 seconds. Called patient to assess and check medication compliance. No answer, LMOVM.

## 2020-07-17 NOTE — Telephone Encounter (Signed)
LMOM per DPR to call DC. DC # and office hours provided.

## 2020-07-21 NOTE — Telephone Encounter (Signed)
Called patient to assess. No answer, LMOVM.   Certified letter sent.

## 2020-09-14 ENCOUNTER — Ambulatory Visit (INDEPENDENT_AMBULATORY_CARE_PROVIDER_SITE_OTHER): Payer: BC Managed Care – PPO

## 2020-09-14 DIAGNOSIS — I495 Sick sinus syndrome: Secondary | ICD-10-CM

## 2020-09-15 LAB — CUP PACEART REMOTE DEVICE CHECK
Date Time Interrogation Session: 20220418151900
Implantable Lead Implant Date: 20180411
Implantable Lead Implant Date: 20180411
Implantable Lead Location: 753859
Implantable Lead Location: 753860
Implantable Lead Model: 377
Implantable Lead Model: 377
Implantable Lead Serial Number: 49852560
Implantable Lead Serial Number: 49852585
Implantable Pulse Generator Implant Date: 20180411
Pulse Gen Model: 407145
Pulse Gen Serial Number: 69007337

## 2020-09-29 NOTE — Progress Notes (Signed)
Remote pacemaker transmission.
